# Patient Record
Sex: Female | Born: 1935 | Race: White | Hispanic: No | Smoking: Never smoker
Health system: Southern US, Community
[De-identification: ages and names within clinical notes are randomized; demographics above are authoritative.]

## PROBLEM LIST (undated history)

## (undated) DIAGNOSIS — J9611 Chronic respiratory failure with hypoxia: Secondary | ICD-10-CM

## (undated) DIAGNOSIS — I743 Embolism and thrombosis of arteries of the lower extremities: Secondary | ICD-10-CM

## (undated) DIAGNOSIS — Z87891 Personal history of nicotine dependence: Secondary | ICD-10-CM

## (undated) DIAGNOSIS — I82409 Acute embolism and thrombosis of unspecified deep veins of unspecified lower extremity: Secondary | ICD-10-CM

## (undated) DIAGNOSIS — Z8719 Personal history of other diseases of the digestive system: Secondary | ICD-10-CM

## (undated) DIAGNOSIS — E46 Unspecified protein-calorie malnutrition: Secondary | ICD-10-CM

## (undated) DIAGNOSIS — Z8673 Personal history of transient ischemic attack (TIA), and cerebral infarction without residual deficits: Secondary | ICD-10-CM

## (undated) DIAGNOSIS — D509 Iron deficiency anemia, unspecified: Secondary | ICD-10-CM

## (undated) DIAGNOSIS — I4891 Unspecified atrial fibrillation: Secondary | ICD-10-CM

## (undated) DIAGNOSIS — E0590 Thyrotoxicosis, unspecified without thyrotoxic crisis or storm: Secondary | ICD-10-CM

## (undated) DIAGNOSIS — G4733 Obstructive sleep apnea (adult) (pediatric): Secondary | ICD-10-CM

## (undated) DIAGNOSIS — J449 Chronic obstructive pulmonary disease, unspecified: Secondary | ICD-10-CM

## (undated) DIAGNOSIS — R296 Repeated falls: Secondary | ICD-10-CM

## (undated) HISTORY — PX: HERNIA REPAIR: SHX51

## (undated) HISTORY — PX: PERIPHERAL VASCULAR THROMBECTOMY: CATH118306

## (undated) HISTORY — PX: BACK SURGERY: SHX140

---

## 2015-07-31 DIAGNOSIS — Z8673 Personal history of transient ischemic attack (TIA), and cerebral infarction without residual deficits: Secondary | ICD-10-CM

## 2015-07-31 HISTORY — DX: Personal history of transient ischemic attack (TIA), and cerebral infarction without residual deficits: Z86.73

## 2015-10-15 DIAGNOSIS — Z8719 Personal history of other diseases of the digestive system: Secondary | ICD-10-CM

## 2015-10-15 HISTORY — DX: Personal history of other diseases of the digestive system: Z87.19

## 2016-08-21 DIAGNOSIS — I743 Embolism and thrombosis of arteries of the lower extremities: Secondary | ICD-10-CM

## 2016-08-21 HISTORY — DX: Embolism and thrombosis of arteries of the lower extremities: I74.3

## 2017-01-25 ENCOUNTER — Emergency Department (INDEPENDENT_AMBULATORY_CARE_PROVIDER_SITE_OTHER): Payer: Medicare Other

## 2017-01-25 ENCOUNTER — Other Ambulatory Visit: Payer: Self-pay

## 2017-01-25 ENCOUNTER — Emergency Department (INDEPENDENT_AMBULATORY_CARE_PROVIDER_SITE_OTHER)
Admission: EM | Admit: 2017-01-25 | Discharge: 2017-01-25 | Disposition: A | Discharge status: Other Acute Inpatient Hospital | Payer: Medicare Other | Source: Home / Self Care | Attending: Family Medicine | Admitting: Family Medicine

## 2017-01-25 DIAGNOSIS — I480 Paroxysmal atrial fibrillation: Secondary | ICD-10-CM

## 2017-01-25 DIAGNOSIS — R0602 Shortness of breath: Secondary | ICD-10-CM

## 2017-01-25 DIAGNOSIS — R0902 Hypoxemia: Secondary | ICD-10-CM

## 2017-01-25 HISTORY — DX: Acute embolism and thrombosis of unspecified deep veins of unspecified lower extremity: I82.409

## 2017-01-25 HISTORY — DX: Unspecified atrial fibrillation: I48.91

## 2017-01-25 LAB — POCT URINALYSIS DIP (MANUAL ENTRY)
Bilirubin, UA: NEGATIVE
Glucose, UA: NEGATIVE mg/dL
Ketones, POC UA: NEGATIVE mg/dL
Leukocytes, UA: NEGATIVE
Nitrite, UA: NEGATIVE
Protein Ur, POC: NEGATIVE mg/dL
Spec Grav, UA: 1.015 (ref 1.010–1.025)
Urobilinogen, UA: 0.2 E.U./dL
pH, UA: 6.5 (ref 5.0–8.0)

## 2017-01-25 LAB — POCT CBC W AUTO DIFF (K'VILLE URGENT CARE)

## 2017-01-25 NOTE — ED Notes (Signed)
Attempted x2 in left and right antecubital unsuccessfully.  Pt tolerated well.  No bruising noted.

## 2017-01-25 NOTE — ED Triage Notes (Signed)
Pt moved in to [facility] about 1 week ago.  Has not been not feeling for several days.  Daughter thinks she may have a UTI.  Fell off the shower stool this morning while taking a shower, states that she felt shaky inside.

## 2017-01-25 NOTE — ED Notes (Signed)
O2 sats L-94 Sit- 88 Stand - 91

## 2017-01-25 NOTE — ED Provider Notes (Signed)
CSN: [id number]     Arrival date & time 01/25/17  1538 History   First MD Initiated Contact with Patient 01/25/17 1526     Chief Complaint  Patient presents with  . Shortness of Breath  . Fatigue   (Consider location/radiation/quality/duration/timing/severity/associated sxs/prior Treatment) HPI Madison Roberson is a 83 y.o. female with hx of Afib and DVT on Xarelto presenting to UC with daughter c/o gradually worsening SOB over the last 1 week with associated generalized weakness. Pt moved into [place] about 1 month ago.  She notes she has had decreased appetite.  She did fall about 1 week ago and broke 3 ribs on Right side. She went to ED at that time and was prescribed hydrocodone for the past but was not admitted. That pain has improved significantly.  Daughter states pt did fall this morning while in the shower. A nurse was with pt. Pt and nurse stated she did not hit her head and pt does not believe she hit her ribs.  She was advised by PT a few days ago she needs to be on oxygen all the time.  She has been on about 2.5L but states she takes it off to go the the bathroom, which is when she started to feel bad.  Pt and daughter are also concerned about possible urinary infection.  Last time she had a UTI, a few years ago, she felt bad overall but did not have specific urinary symptoms.  Denies fever, chills, n/v/d.  She f/u with her cardiologist every 6 months.  She has been told before she has mild CHF. Last visit was about 4 months ago.  O2 Sat in triage prior to O2: 77% on RA O2 quickly improved to low 90s with 3L O2 via nasal canula.   Past Medical History:  Diagnosis Date  . Atrial fibrillation (HCC)   . DVT (deep venous thrombosis) (HCC)   . Hypoxia   . Stroke [facility])    Past Surgical History:  Procedure Laterality Date  . BACK SURGERY     2 Broken vertebrae  . HERNIA REPAIR    . PERIPHERAL VASCULAR THROMBECTOMY     2015 & 2018   History reviewed. No pertinent family  history. Social History  Substance Use Topics  . Smoking status: Not on file  . Smokeless tobacco: Not on file  . Alcohol use No   OB History    No data available     Review of Systems  Constitutional: Positive for fatigue. Negative for chills and fever.  HENT: Negative for congestion.   Respiratory: Positive for chest tightness and shortness of breath. Negative for wheezing.   Cardiovascular: Negative for chest pain, palpitations and leg swelling.  Gastrointestinal: Negative for diarrhea, nausea and vomiting.  Genitourinary: Negative for dysuria, frequency and hematuria.  Neurological: Positive for weakness (generalized). Negative for dizziness, syncope, light-headedness and headaches.    Allergies  Patient has no known allergies.  Home Medications   Prior to Admission medications   Medication Sig Start Date End Date Taking? Authorizing Provider  aspirin 81 MG chewable tablet Chew by mouth daily.   Yes [provider]  cetirizine (ZYRTEC) 5 MG tablet Take 5 mg by mouth daily.   Yes [provider]  digoxin (LANOXIN) 0.25 MG tablet Take 0.25 mg by mouth daily.    Yes [provider]  diltiazem (CARDIZEM) 120 MG tablet Take 120 mg by mouth 4 (four) times daily.   Yes [provider]  FLUoxetine (  PROZAC) 20 MG capsule Take 20 mg by mouth daily.   Yes [provider]  rivaroxaban (XARELTO) 10 MG TABS tablet Take 10 mg by mouth daily.   Yes [provider]   Meds Ordered and Administered this Visit  Medications - No data to display  BP (!) 145/85 (BP Location: Left Arm)   Pulse 85   Temp 97.7 F (36.5 C) (Oral)   SpO2 95%  Orthostatic VS for the past 24 hrs:  BP- Lying Pulse- Lying BP- Sitting Pulse- Sitting BP- Standing at 0 minutes Pulse- Standing at 0 minutes  01/25/17 1652 149/81 81 149/87 83 135/80 79    Physical Exam  Constitutional: She is oriented to person, place, and time. She appears well-developed and  well-nourished. No distress.  Elderly female sitting in wheelchair, NAD. Alert and cooperative during exam.  HENT:  Head: Normocephalic and atraumatic.  Mouth/Throat: Oropharynx is clear and moist.  Eyes: EOM are normal.  Neck: Normal range of motion. Neck supple.  Cardiovascular: Normal rate.  An irregularly irregular rhythm present.  Pulmonary/Chest: Effort normal and breath sounds normal. No respiratory distress. She has no wheezes. She has no rales. She exhibits no tenderness.  Abdominal: Soft. She exhibits no distension and no mass. There is no tenderness. There is no rebound and no guarding.  Musculoskeletal: Normal range of motion.  Neurological: She is alert and oriented to person, place, and time.  Skin: Skin is warm and dry. She is not diaphoretic.  Psychiatric: She has a normal mood and affect. Her behavior is normal.  Nursing note and vitals reviewed.   Urgent Care Course     Procedures (including critical care time)  Labs Review Labs Reviewed  POCT URINALYSIS DIP (MANUAL ENTRY) - Abnormal; Notable for the following:       Result Value   Blood, UA small (*)    All other components within normal limits  URINE CULTURE  POCT CBC W AUTO DIFF ([facility])    Imaging Review Dg Chest 2 View  Result Date: 01/25/2017 CLINICAL DATA:  83 year old female with history of weakness and shortness of breath. EXAM: CHEST  2 VIEW COMPARISON:  No priors. FINDINGS: Diffuse interstitial prominence and peribronchial cuffing. Small bilateral pleural effusions lying dependently. Dependent opacities bilaterally, presumably areas of passive subsegmental atelectasis in the lung bases. No definite acute consolidative airspace disease. No pneumothorax. No definite suspicious appearing pulmonary nodules or masses. Cephalization of the pulmonary vasculature. Mild cardiomegaly. Upper mediastinal contours are within normal limits. Aortic atherosclerosis. Lateral view demonstrates multiple old  vertebral body compression fractures with multiple vertebroplasty changes, most severe in the lower thoracic spine where there is an acute kyphotic deformity. IMPRESSION: 1. The appearance the chest suggests mild congestive heart failure, as above. 2. Aortic atherosclerosis. Electronically Signed   By: Trudie Reedaniel  Entrikin M.D.   On: 01/25/2017 16:22   Date/Time: 01/25/2017   15:51:53 Ventricular Rate: 70 PR Interval: * QRS Duration: 96 QT Interval: 450 QTC Calculation: 486 P-R-T axes: *   72   -87 Text Interpretation: Atrial fibrillation, septal infarct, age undetermined. ST & T wave abnormality, consider inferolateral ischemia. Abnormal EKG    MDM   1. Shortness of breath   2. Paroxysmal atrial fibrillation (HCC)     Pt c/o generalized weakness and fatigue with SOB over the last few days. Moved to [facility] 1 week ago.  Daughter accompanying pt very thorough with pt's PMH.  EKG: atrial fibrillation with Septal infarct, age undetermined. Discussed  with pt and daughter. Pt was believed to have had an MI in the past, had a heart cath that was normal. Hx of abnormal EKGs. F/u with cardiologist every 6 months.   CBC: slight elevated WBC, Platelets elevated   Discussed going to ED for further evaluation  Pt and daughter agreeable, would prevent to stay with [facility]. Due to cardiac hx, recommend [facility].  Pt agreeable with EMS transport due to need for continuous O2 via nasal canula.   5:53 PM EMS took over pt care. Due to high volume with EMS, EMS cannot leave [facility].  Pt will be taken to [facility] for further evaluation and treatment. Pt and daughter understanding and agreeable.      Lurene Shadow, [place] 01/25/17 1753

## 2017-01-26 ENCOUNTER — Telehealth: Payer: Self-pay | Admitting: *Deleted

## 2017-01-26 LAB — URINE CULTURE: Organism ID, Bacteria: NO GROWTH

## 2017-01-26 NOTE — Telephone Encounter (Signed)
[facility]. Her family member did not know any more information.

## 2017-07-25 HISTORY — PX: IM NAILING FEMORAL SHAFT FRACTURE: SUR731

## 2017-10-02 ENCOUNTER — Inpatient Hospital Stay (HOSPITAL_COMMUNITY)
Admission: EM | Admit: 2017-10-02 | Discharge: 2017-10-05 | DRG: 064 | Disposition: A | Discharge status: Skilled Nursing Facility | Payer: Medicare Other | Attending: Family Medicine | Admitting: Family Medicine

## 2017-10-02 ENCOUNTER — Other Ambulatory Visit: Payer: Self-pay

## 2017-10-02 ENCOUNTER — Emergency Department (HOSPITAL_COMMUNITY): Payer: Medicare Other

## 2017-10-02 ENCOUNTER — Encounter (HOSPITAL_COMMUNITY): Payer: Self-pay

## 2017-10-02 DIAGNOSIS — Z7902 Long term (current) use of antithrombotics/antiplatelets: Secondary | ICD-10-CM

## 2017-10-02 DIAGNOSIS — R296 Repeated falls: Secondary | ICD-10-CM

## 2017-10-02 DIAGNOSIS — G8194 Hemiplegia, unspecified affecting left nondominant side: Secondary | ICD-10-CM | POA: Diagnosis present

## 2017-10-02 DIAGNOSIS — E872 Acidosis: Secondary | ICD-10-CM | POA: Diagnosis present

## 2017-10-02 DIAGNOSIS — Z681 Body mass index (BMI) 19 or less, adult: Secondary | ICD-10-CM | POA: Diagnosis not present

## 2017-10-02 DIAGNOSIS — R29810 Facial weakness: Secondary | ICD-10-CM | POA: Diagnosis present

## 2017-10-02 DIAGNOSIS — I482 Chronic atrial fibrillation: Secondary | ICD-10-CM | POA: Diagnosis present

## 2017-10-02 DIAGNOSIS — I110 Hypertensive heart disease with heart failure: Secondary | ICD-10-CM | POA: Diagnosis present

## 2017-10-02 DIAGNOSIS — G894 Chronic pain syndrome: Secondary | ICD-10-CM | POA: Diagnosis present

## 2017-10-02 DIAGNOSIS — I4891 Unspecified atrial fibrillation: Secondary | ICD-10-CM | POA: Diagnosis present

## 2017-10-02 DIAGNOSIS — E860 Dehydration: Secondary | ICD-10-CM | POA: Diagnosis present

## 2017-10-02 DIAGNOSIS — J449 Chronic obstructive pulmonary disease, unspecified: Secondary | ICD-10-CM | POA: Diagnosis present

## 2017-10-02 DIAGNOSIS — D509 Iron deficiency anemia, unspecified: Secondary | ICD-10-CM | POA: Diagnosis present

## 2017-10-02 DIAGNOSIS — Z79899 Other long term (current) drug therapy: Secondary | ICD-10-CM

## 2017-10-02 DIAGNOSIS — I5042 Chronic combined systolic (congestive) and diastolic (congestive) heart failure: Secondary | ICD-10-CM | POA: Diagnosis present

## 2017-10-02 DIAGNOSIS — G4733 Obstructive sleep apnea (adult) (pediatric): Secondary | ICD-10-CM | POA: Diagnosis present

## 2017-10-02 DIAGNOSIS — R414 Neurologic neglect syndrome: Secondary | ICD-10-CM | POA: Diagnosis present

## 2017-10-02 DIAGNOSIS — Z87891 Personal history of nicotine dependence: Secondary | ICD-10-CM | POA: Diagnosis not present

## 2017-10-02 DIAGNOSIS — Z8673 Personal history of transient ischemic attack (TIA), and cerebral infarction without residual deficits: Secondary | ICD-10-CM

## 2017-10-02 DIAGNOSIS — I481 Persistent atrial fibrillation: Secondary | ICD-10-CM

## 2017-10-02 DIAGNOSIS — G9341 Metabolic encephalopathy: Secondary | ICD-10-CM | POA: Diagnosis not present

## 2017-10-02 DIAGNOSIS — J9611 Chronic respiratory failure with hypoxia: Secondary | ICD-10-CM | POA: Diagnosis present

## 2017-10-02 DIAGNOSIS — E43 Unspecified severe protein-calorie malnutrition: Secondary | ICD-10-CM | POA: Diagnosis not present

## 2017-10-02 DIAGNOSIS — I639 Cerebral infarction, unspecified: Secondary | ICD-10-CM | POA: Diagnosis not present

## 2017-10-02 DIAGNOSIS — Z9981 Dependence on supplemental oxygen: Secondary | ICD-10-CM | POA: Diagnosis not present

## 2017-10-02 DIAGNOSIS — D500 Iron deficiency anemia secondary to blood loss (chronic): Secondary | ICD-10-CM | POA: Diagnosis present

## 2017-10-02 DIAGNOSIS — Z7901 Long term (current) use of anticoagulants: Secondary | ICD-10-CM

## 2017-10-02 DIAGNOSIS — I351 Nonrheumatic aortic (valve) insufficiency: Secondary | ICD-10-CM | POA: Diagnosis not present

## 2017-10-02 DIAGNOSIS — Z5181 Encounter for therapeutic drug level monitoring: Secondary | ICD-10-CM | POA: Diagnosis not present

## 2017-10-02 DIAGNOSIS — Z86718 Personal history of other venous thrombosis and embolism: Secondary | ICD-10-CM

## 2017-10-02 DIAGNOSIS — E0500 Thyrotoxicosis with diffuse goiter without thyrotoxic crisis or storm: Secondary | ICD-10-CM | POA: Diagnosis present

## 2017-10-02 DIAGNOSIS — R5383 Other fatigue: Secondary | ICD-10-CM | POA: Diagnosis not present

## 2017-10-02 DIAGNOSIS — E46 Unspecified protein-calorie malnutrition: Secondary | ICD-10-CM | POA: Diagnosis present

## 2017-10-02 DIAGNOSIS — E039 Hypothyroidism, unspecified: Secondary | ICD-10-CM | POA: Diagnosis present

## 2017-10-02 DIAGNOSIS — Z9181 History of falling: Secondary | ICD-10-CM

## 2017-10-02 DIAGNOSIS — Z882 Allergy status to sulfonamides status: Secondary | ICD-10-CM

## 2017-10-02 DIAGNOSIS — R112 Nausea with vomiting, unspecified: Secondary | ICD-10-CM | POA: Diagnosis present

## 2017-10-02 DIAGNOSIS — E876 Hypokalemia: Secondary | ICD-10-CM | POA: Diagnosis present

## 2017-10-02 DIAGNOSIS — I63411 Cerebral infarction due to embolism of right middle cerebral artery: Principal | ICD-10-CM | POA: Diagnosis present

## 2017-10-02 DIAGNOSIS — R29711 NIHSS score 11: Secondary | ICD-10-CM | POA: Diagnosis present

## 2017-10-02 DIAGNOSIS — Z888 Allergy status to other drugs, medicaments and biological substances status: Secondary | ICD-10-CM

## 2017-10-02 DIAGNOSIS — E0590 Thyrotoxicosis, unspecified without thyrotoxic crisis or storm: Secondary | ICD-10-CM | POA: Diagnosis present

## 2017-10-02 DIAGNOSIS — I712 Thoracic aortic aneurysm, without rupture: Secondary | ICD-10-CM | POA: Diagnosis present

## 2017-10-02 DIAGNOSIS — Z7982 Long term (current) use of aspirin: Secondary | ICD-10-CM

## 2017-10-02 HISTORY — DX: Obstructive sleep apnea (adult) (pediatric): G47.33

## 2017-10-02 HISTORY — DX: Personal history of other diseases of the digestive system: Z87.19

## 2017-10-02 HISTORY — DX: Personal history of transient ischemic attack (TIA), and cerebral infarction without residual deficits: Z86.73

## 2017-10-02 HISTORY — DX: Unspecified protein-calorie malnutrition: E46

## 2017-10-02 HISTORY — DX: Chronic obstructive pulmonary disease, unspecified: J44.9

## 2017-10-02 HISTORY — DX: Chronic respiratory failure with hypoxia: J96.11

## 2017-10-02 HISTORY — DX: Embolism and thrombosis of arteries of the lower extremities: I74.3

## 2017-10-02 HISTORY — DX: Repeated falls: R29.6

## 2017-10-02 HISTORY — DX: Thyrotoxicosis, unspecified without thyrotoxic crisis or storm: E05.90

## 2017-10-02 HISTORY — DX: Iron deficiency anemia, unspecified: D50.9

## 2017-10-02 HISTORY — DX: Personal history of nicotine dependence: Z87.891

## 2017-10-02 LAB — COMPREHENSIVE METABOLIC PANEL
ALBUMIN: 3.7 g/dL (ref 3.5–5.0)
ALK PHOS: 163 U/L — AB (ref 38–126)
ALT: 9 U/L — ABNORMAL LOW (ref 14–54)
AST: 22 U/L (ref 15–41)
Anion gap: 16 — ABNORMAL HIGH (ref 5–15)
BILIRUBIN TOTAL: 0.7 mg/dL (ref 0.3–1.2)
BUN: 9 mg/dL (ref 6–20)
CALCIUM: 8.9 mg/dL (ref 8.9–10.3)
CO2: 18 mmol/L — ABNORMAL LOW (ref 22–32)
Chloride: 105 mmol/L (ref 101–111)
Creatinine, Ser: 0.82 mg/dL (ref 0.44–1.00)
GFR calc Af Amer: >60 mL/min (ref >60)
GLUCOSE: 148 mg/dL — AB (ref 65–99)
Potassium: 3.9 mmol/L (ref 3.5–5.1)
Sodium: 139 mmol/L (ref 135–145)
TOTAL PROTEIN: 7.1 g/dL (ref 6.5–8.1)

## 2017-10-02 LAB — URINALYSIS, ROUTINE W REFLEX MICROSCOPIC
Bilirubin Urine: NEGATIVE
GLUCOSE, UA: NEGATIVE mg/dL
Hgb urine dipstick: NEGATIVE
Ketones, ur: NEGATIVE mg/dL
LEUKOCYTES UA: NEGATIVE
Nitrite: NEGATIVE
PH: 7 (ref 5.0–8.0)
PROTEIN: NEGATIVE mg/dL
Specific Gravity, Urine: 1.039 — ABNORMAL HIGH (ref 1.005–1.030)

## 2017-10-02 LAB — DIFFERENTIAL
Basophils Absolute: 0.1 10*3/uL (ref 0.0–0.1)
Basophils Relative: 1 %
EOS PCT: 1 %
Eosinophils Absolute: 0.1 10*3/uL (ref 0.0–0.7)
LYMPHS ABS: 2.2 10*3/uL (ref 0.7–4.0)
LYMPHS PCT: 20 %
MONOS PCT: 4 %
Monocytes Absolute: 0.5 10*3/uL (ref 0.1–1.0)
NEUTROS PCT: 74 %
Neutro Abs: 8 10*3/uL — ABNORMAL HIGH (ref 1.7–7.7)

## 2017-10-02 LAB — I-STAT TROPONIN, ED: Troponin i, poc: 0.06 ng/mL (ref 0.00–0.08)

## 2017-10-02 LAB — PROTIME-INR
INR: 1
Prothrombin Time: 13.1 seconds (ref 11.4–15.2)

## 2017-10-02 LAB — I-STAT CHEM 8, ED
BUN: 10 mg/dL (ref 6–20)
CREATININE: 0.7 mg/dL (ref 0.44–1.00)
Calcium, Ion: 1.05 mmol/L — ABNORMAL LOW (ref 1.15–1.40)
Chloride: 106 mmol/L (ref 101–111)
Glucose, Bld: 148 mg/dL — ABNORMAL HIGH (ref 65–99)
HCT: 50 % — ABNORMAL HIGH (ref 36.0–46.0)
Hemoglobin: 17 g/dL — ABNORMAL HIGH (ref 12.0–15.0)
POTASSIUM: 4 mmol/L (ref 3.5–5.1)
Sodium: 141 mmol/L (ref 135–145)
TCO2: 22 mmol/L (ref 22–32)

## 2017-10-02 LAB — APTT: aPTT: 29 seconds (ref 24–36)

## 2017-10-02 LAB — CBG MONITORING, ED: Glucose-Capillary: 130 mg/dL — ABNORMAL HIGH (ref 65–99)

## 2017-10-02 LAB — CBC
HEMATOCRIT: 48.8 % — AB (ref 36.0–46.0)
HEMOGLOBIN: 15.2 g/dL — AB (ref 12.0–15.0)
MCH: 27.9 pg (ref 26.0–34.0)
MCHC: 31.1 g/dL (ref 30.0–36.0)
MCV: 89.5 fL (ref 78.0–100.0)
Platelets: 299 10*3/uL (ref 150–400)
RBC: 5.45 MIL/uL — ABNORMAL HIGH (ref 3.87–5.11)
RDW: 21.7 % — ABNORMAL HIGH (ref 11.5–15.5)
WBC: 10.8 10*3/uL — ABNORMAL HIGH (ref 4.0–10.5)

## 2017-10-02 MED ORDER — HYDRALAZINE HCL 20 MG/ML IJ SOLN
10.0000 mg | Freq: Four times a day (QID) | INTRAMUSCULAR | Status: DC | PRN
Start: 2017-10-02 — End: 2017-10-05

## 2017-10-02 MED ORDER — FLUOXETINE HCL 20 MG PO CAPS
20.0000 mg | ORAL_CAPSULE | Freq: Every day | ORAL | Status: DC
  Administered 2017-10-02 – 2017-10-05 (×4): 20 mg via ORAL
  Filled 2017-10-02 (×4): qty 1

## 2017-10-02 MED ORDER — SODIUM CHLORIDE 0.9 % IV SOLN
INTRAVENOUS | Status: DC
  Administered 2017-10-02 – 2017-10-03 (×2): via INTRAVENOUS

## 2017-10-02 MED ORDER — DIGOXIN 125 MCG PO TABS
0.1250 mg | ORAL_TABLET | Freq: Every day | ORAL | Status: DC
  Administered 2017-10-02 – 2017-10-05 (×4): 0.125 mg via ORAL
  Filled 2017-10-02 (×4): qty 1

## 2017-10-02 MED ORDER — STROKE: EARLY STAGES OF RECOVERY BOOK
Freq: Once | Status: DC
  Filled 2017-10-02: qty 1

## 2017-10-02 MED ORDER — PANTOPRAZOLE SODIUM 40 MG PO TBEC
40.0000 mg | DELAYED_RELEASE_TABLET | Freq: Every day | ORAL | Status: DC
  Administered 2017-10-02 – 2017-10-05 (×4): 40 mg via ORAL
  Filled 2017-10-02 (×4): qty 1

## 2017-10-02 MED ORDER — IOPAMIDOL (ISOVUE-370) INJECTION 76%
INTRAVENOUS | Status: AC
  Filled 2017-10-02: qty 50

## 2017-10-02 MED ORDER — CLOPIDOGREL BISULFATE 75 MG PO TABS
75.0000 mg | ORAL_TABLET | Freq: Every day | ORAL | Status: DC
  Administered 2017-10-02 – 2017-10-05 (×4): 75 mg via ORAL
  Filled 2017-10-02 (×4): qty 1

## 2017-10-02 MED ORDER — METHIMAZOLE 5 MG PO TABS
5.0000 mg | ORAL_TABLET | Freq: Every day | ORAL | Status: DC
  Administered 2017-10-02 – 2017-10-05 (×4): 5 mg via ORAL
  Filled 2017-10-02 (×4): qty 1

## 2017-10-02 MED ORDER — IOPAMIDOL (ISOVUE-370) INJECTION 76%: 100.0000 mL | Freq: Once | INTRAVENOUS | Status: DC | PRN

## 2017-10-02 MED ORDER — MIRTAZAPINE 15 MG PO TABS
15.0000 mg | ORAL_TABLET | Freq: Every day | ORAL | Status: DC
  Administered 2017-10-02 – 2017-10-04 (×3): 15 mg via ORAL
  Filled 2017-10-02 (×4): qty 1

## 2017-10-02 MED ORDER — IOPAMIDOL (ISOVUE-370) INJECTION 76%
100.0000 mL | Freq: Once | INTRAVENOUS | Status: AC | PRN
  Administered 2017-10-02: 100 mL via INTRAVENOUS

## 2017-10-02 MED ORDER — LORATADINE 10 MG PO TABS
10.0000 mg | ORAL_TABLET | Freq: Every day | ORAL | Status: DC
  Administered 2017-10-02 – 2017-10-05 (×4): 10 mg via ORAL
  Filled 2017-10-02 (×4): qty 1

## 2017-10-02 MED ORDER — FERROUS GLUCONATE 324 (38 FE) MG PO TABS
324.0000 mg | ORAL_TABLET | Freq: Three times a day (TID) | ORAL | Status: DC
  Administered 2017-10-03 – 2017-10-05 (×8): 324 mg via ORAL
  Filled 2017-10-02 (×10): qty 1

## 2017-10-02 MED ORDER — ACETAMINOPHEN 325 MG PO TABS
650.0000 mg | ORAL_TABLET | ORAL | Status: DC | PRN
  Administered 2017-10-02: 650 mg via ORAL
  Filled 2017-10-02: qty 2

## 2017-10-02 MED ORDER — OXYCODONE HCL 5 MG PO TABS
10.0000 mg | ORAL_TABLET | Freq: Three times a day (TID) | ORAL | Status: DC
  Administered 2017-10-02 – 2017-10-03 (×5): 10 mg via ORAL
  Filled 2017-10-02 (×6): qty 2

## 2017-10-02 MED ORDER — ENOXAPARIN SODIUM 40 MG/0.4ML ~~LOC~~ SOLN
40.0000 mg | SUBCUTANEOUS | Status: DC
  Administered 2017-10-02 – 2017-10-05 (×4): 40 mg via SUBCUTANEOUS
  Filled 2017-10-02 (×5): qty 0.4

## 2017-10-02 MED ORDER — ACETAMINOPHEN 160 MG/5ML PO SOLN: 650.0000 mg | ORAL | Status: DC | PRN

## 2017-10-02 MED ORDER — ACETAMINOPHEN 650 MG RE SUPP: 650.0000 mg | RECTAL | Status: DC | PRN

## 2017-10-02 MED ORDER — ATORVASTATIN CALCIUM 40 MG PO TABS
40.0000 mg | ORAL_TABLET | Freq: Every day | ORAL | Status: DC
  Administered 2017-10-02 – 2017-10-04 (×3): 40 mg via ORAL
  Filled 2017-10-02 (×4): qty 1

## 2017-10-02 MED ORDER — ASPIRIN EC 325 MG PO TBEC
325.0000 mg | DELAYED_RELEASE_TABLET | Freq: Every day | ORAL | Status: DC
  Administered 2017-10-02: 325 mg via ORAL
  Filled 2017-10-02: qty 1

## 2017-10-02 MED ORDER — POTASSIUM CHLORIDE CRYS ER 20 MEQ PO TBCR
20.0000 meq | EXTENDED_RELEASE_TABLET | Freq: Every day | ORAL | Status: DC
  Administered 2017-10-02 – 2017-10-05 (×4): 20 meq via ORAL
  Filled 2017-10-02 (×4): qty 1

## 2017-10-02 NOTE — Consult Note (Signed)
Referring Physician: Dr. Jeraldine Loots    Chief Complaint: Left hemiplegia and left hemineglect  HPI: Madison Roberson is an 84 y.o. female presenting from her SNF after staff found her to be hemiplegic on the left and with slurred speech this morning. LKN was 2100. On EMS arrival her BP was 138/91. Per EMS, she was drooling on the left, had left facial droop, not moving or paying attention to her left side. She was in a-fib at the time of EMS assessment with HR as high as 170, which later decreased to 100. CBG was 174. She endorsed no symptoms except for SOB and cough. No headache.   LSN: [id number] tPA Given: No: Out of time window  Past Medical History:  Diagnosis Date  . Atrial fibrillation (HCC)   . DVT (deep venous thrombosis) (HCC)   . Hypoxia   . Stroke [facility])     Past Surgical History:  Procedure Laterality Date  . BACK SURGERY     2 Broken vertebrae  . HERNIA REPAIR    . PERIPHERAL VASCULAR THROMBECTOMY     2015 & 2018    No family history on file. Social History:  reports that she does not drink alcohol or use drugs. Her tobacco history is not on file.  Allergies: No Known Allergies  Home Medications: ASA Diltiazem Ferrous gluconate Klor-Con Lanoxin Melatonin Methimazole Oxycodone Prozac Remeron Tylenol Zyrtec  ROS: As per HPI.   Physical Examination: There were no vitals taken for this visit.  General: Frail appearing, NAD HEENT: [place]/AT Lungs: Respirations unlabored Ext: Thin skin, no edema  Neurologic Examination: Mental Status: Awake and alert. Fully oriented. Able to follow all right sided commands. Anosognosia with left sided neglect.  Cranial Nerves: II:  PERRL. Left homonymous hemianopsia III,IV, VI: No ptosis.  Right gaze preference; able to cross midline and gaze to the left but with some hesitancy V,VII: Mild left facial droop. Insensate to FT and temp on the left.   VIII: hearing intact to voice IX,X: No hypophonia XI: Head preferentially rotated  to right.  Motor: LUE: 0/5, no movement spontaneously or to any stimulus. Flaccid tone. LLE: Toes moving spontaneously, elevates 3/5 RUE: 5/5 for age RLE: 5/5 for age Sensory: Left hemineglect with absent FT sensation. Winces to noxious stimulation of LUE but does not localize.  Deep Tendon Reflexes:  2+ bilateral brachioradialis and biceps 2+ patellae bilaterally Right toe upgoing, left toe downgoing Cerebellar: No ataxia with FNF on the right. Unable to perform on the left.  Gait: Deferred due to acuity of presentation  Results for orders placed or performed during the hospital encounter of 10/02/17 (from the past 48 hour(s))  CBG monitoring, ED     Status: Abnormal   Collection Time: 10/02/17  9:46 AM  Result Value Ref Range   Glucose-Capillary 130 (H) 65 - 99 mg/dL   CT head: 1. No acute cortically based infarct or acute intracranial hemorrhage identified. ASPECTS 10. 2. Chronic small vessel disease with age indeterminate involvement of the right pons. 3. Left greater than right cerebellar infarcts, probably chronic.  CTA head/neck, CTP: 1. Constellation of findings suspicious for a right MCA M2 occlusion at the trifurcation, although no discrete occluded vessel is identified on the CTA. Some artifactual findings on CT perfusion are suspected, but CTP suggests a posterior right MCA territory penumbra with no core infarct detected. 2. The above was discussed by telephone with Dr. Caryl Pina on 10/02/2017 at 1024 hours. 3. Underlying generalized Arterial Ectasia, including ectatic aortic  arch (see #5), bilateral carotid arteries, and aberrant right subclavian artery. Atherosclerosis, but no hemodynamically significant stenosis in the neck or at the skull base. 4. Aortic Atherosclerosis (ICD10-I70.0) and Emphysema (ICD10-J43.9). 5. 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 female presenting with acute ischemic stroke involving the  left MCA territory.  1. No hemorrhage or acute hypodensity on CT head. Chronic cerebellar infarcts are noted.  2. CTA: Constellation of findings suspicious for a right MCA M2 occlusion at the trifurcation, although no discrete occluded vessel is identified on the CTA. 3. CTP: Some artifactual findings on CT perfusion are suspected, but CTP suggests a posterior right MCA territory penumbra with no core infarct detected. 4. Stroke Risk Factors - Atrial fiibrillation, DVT, prior stroke.  5. History of frequent falls, which may be the reason why she is not on anticoagulation  Plan: 1. HgbA1c, fasting lipid panel 2. MRI, of the brain without contrast 3. PT consult, OT consult, Speech consult 4. Echocardiogram 5. Telemetry monitoring 6. Prophylactic therapy- Switch ASA to Plavix 7. Risk factor modification 8. Frequent neuro checks 9. Modified permissive HTN protocol for 24 hours: Treat BP if systolic is > 160 10. Gentle IV hydration  50 minutes of time spent in the emergent neurological evaluation and management of this critically ill acute stroke patient  @Electronically  signed: Dr. Caryl PinaEric Lynell Kussman@ 10/02/2017, 9:52 AM

## 2017-10-02 NOTE — ED Provider Notes (Signed)
[facility] EMERGENCY DEPARTMENT Provider Note   CSN: [id number] Arrival date & time: 10/02/17  0940     History   Chief Complaint No chief complaint on file.   HPI Madison Roberson is a 84 y.o. female.  HPI  Presents after last being seen normal 10 hours ago, before going to sleep in a nursing facility.  Like this morning she was found on the floor by nursing staff. Report of the patient is capable of ambulating, though she does use a wheelchair for assistance. Patient is also typically verbal, interactive, conversational. However, the staff found her nonverbal, with left-sided facial droop, left-sided weakness this morning. She was admitted a code stroke, brought here for evaluation. Level 5 caveat secondary to the patient's acute clinical condition, essentially nonverbal status.  Past Medical History:  Diagnosis Date  . Atrial fibrillation (HCC)   . DVT (deep venous thrombosis) (HCC)   . Hypoxia   . Stroke [facility])     There are no active problems to display for this patient.   Past Surgical History:  Procedure Laterality Date  . BACK SURGERY     2 Broken vertebrae  . HERNIA REPAIR    . PERIPHERAL VASCULAR THROMBECTOMY     2015 & 2018     OB History   None      Home Medications    Prior to Admission medications   Medication Sig Start Date End Date Taking? Authorizing Provider  aspirin 81 MG chewable tablet Chew by mouth daily.    [provider]  cetirizine (ZYRTEC) 5 MG tablet Take 5 mg by mouth daily.    [provider]  digoxin (LANOXIN) 0.25 MG tablet Take 0.25 mg by mouth daily.     [provider]  diltiazem (CARDIZEM) 120 MG tablet Take 120 mg by mouth 4 (four) times daily.    [provider]  FLUoxetine (PROZAC) 20 MG capsule Take 20 mg by mouth daily.    [provider]  rivaroxaban (XARELTO) 10 MG TABS tablet Take 10 mg by mouth daily.    [provider]    Family History No  family history on file.  Social History Social History   Tobacco Use  . Smoking status: Not on file  Substance Use Topics  . Alcohol use: No  . Drug use: No     Allergies   Patient has no known allergies.   Review of Systems Review of Systems  Unable to perform ROS: Mental status change     Physical Exam Updated Vital Signs There were no vitals taken for this visit.  Physical Exam  Constitutional: She has a sickly appearance. No distress.  Sickly elderly female in gurney, with left-sided hemineglect minimally verbal  HENT:  Head: Normocephalic and atraumatic.  Eyes: Conjunctivae and EOM are normal.  Cardiovascular: Normal rate and regular rhythm.  Murmur heard. Pulmonary/Chest: Effort normal and breath sounds normal. No stridor. No respiratory distress.  Abdominal: She exhibits no distension.  Musculoskeletal: She exhibits no edema.  Neurological: She is alert. A cranial nerve deficit is present. She exhibits abnormal muscle tone. She displays no seizure activity.  Patient has left-sided hemineglect, and agnosia, minimally verbal, brief, difficult to understand, left-sided facial droop, left-sided hemiplegia  Skin: Skin is warm and dry.  Psychiatric: Cognition and memory are impaired.  Nursing note and vitals reviewed.    ED Treatments / Results  Labs (all labs ordered are listed, but only abnormal results are displayed) Labs Reviewed  CBG MONITORING, ED - Abnormal; Notable for the following components:      Result Value   Glucose-Capillary 130 (*)    All other components within normal limits  PROTIME-INR  APTT  CBC  DIFFERENTIAL  COMPREHENSIVE METABOLIC PANEL  I-STAT TROPONIN, ED  I-STAT CHEM 8, ED    EKG EKG Interpretation  Date/Time:  Saturday October 02 2017 10:29:03 EDT Ventricular Rate:  94 PR Interval:    QRS Duration: 105 QT Interval:  362 QTC Calculation: 453 R Axis:   102 Text Interpretation:  Atrial fibrillation Right axis deviation  Probable anteroseptal infarct, old Repol abnrm suggests ischemia, diffuse leads Artifact ST-t wave abnormality Abnormal ekg Confirmed by Gerhard MunchLockwood, Croix Presley [phone]([id number]) on 10/02/2017 10:35:50 AM   Radiology Ct Cerebral Perfusion W Contrast  Result Date: 10/02/2017 CLINICAL DATA:  84 year old female code stroke clinically with symptoms of right MCA thrombosis. EXAM: CT ANGIOGRAPHY HEAD AND NECK CT PERFUSION BRAIN TECHNIQUE: Multidetector CT imaging of the head and neck was performed using the standard protocol during bolus administration of intravenous contrast. Multiplanar CT image reconstructions and MIPs were obtained to evaluate the vascular anatomy. Carotid stenosis measurements (when applicable) are obtained utilizing NASCET criteria, using the distal internal carotid diameter as the denominator. Multiphase CT imaging of the brain was performed following IV bolus contrast injection. Subsequent parametric perfusion maps were calculated using RAPID software. CONTRAST:  95 milliliters Isovue 370 COMPARISON:  Presentation noncontrast head CT 0957 hours today. FINDINGS: CT Brain Perfusion Findings: The study is somewhat limited by motion artifact occuring late in the data acquisition. And subsequently abnormal T-max in the posterior fossa is probably artifact. ASPECTS 10 at 0957 hours today. CBF (<30%) Volume: zero, no core infarct detected. Perfusion (Tmax>6.0s) volume: Evidence of penumbra in the posterior right MCA territory, volume less than 36 mL (considering the probably artifactual volume penumbra in the posterior fossa) Mismatch Volume: Not applicable Infarction Location:Not applicable CTA NECK Skeleton: Moderate and severe degenerative changes in the cervical spine including 3-4 millimeters of anterolisthesis of C3 on C4 and reversed cervical lordosis. Underlying thoracic scoliosis. Osteopenia. No acute osseous abnormality identified. Upper chest: Centrilobular emphysema with superimposed dependent pulmonary  opacity in the left upper lobe and superior segment right lower lobe. The visible Major airways are patent. No superior mediastinal lymphadenopathy. Other neck: Large left thyroid goiter with mild regional mass effect. Partially retropharyngeal course of both carotid arteries. Otherwise negative. No cervical lymphadenopathy. Aortic arch: Ectatic visible thoracic aorta with the aortic arch measuring about 36 millimeters diameter and the proximal descending thoracic aorta up to 40 millimeters diameter. Scattered calcified arch atherosclerosis. Four vessel arch configuration with an aberrancy origin of the right subclavian artery. Tortuous proximal great vessels. Right carotid system: Dedicated right CCA origin off of the arch with tortuosity through the superior mediastinum and thoracic inlet. No proximal CCA stenosis. Widely patent right carotid bifurcation with mild calcified plaque. Severely tortuous and ectatic but widely patent cervical right ICA to the skull base. Left carotid system: Calcified plaque at the left CCA origin not resulting in significant stenosis. Tortuous left CCA at the thoracic inlet and in the neck with a partially retropharyngeal course. Bulky calcified plaque at the left carotid bifurcation resulting in an estimated 50 % stenosis with respect to the distal vessel just proximal to the bifurcation. The left ICA origin and bulb then are widely patent. The cervical left ICA is severely tortuous and ectatic but patent to the skull base without additional stenosis. Vertebral arteries: Aberrancy origin of  the right subclavian artery with ectasia and plaque but no stenosis proximal to the right vertebral artery origin. The right vertebral artery origin is patent without stenosis. The right V1 segment is highly tortuous. The right V 2 segment is tortuous. The right vertebral is patent to the skull base without stenosis. No proximal left subclavian artery stenosis despite calcified plaque. Mild left  subclavian tortuosity. Normal left vertebral artery origin. Highly tortuous left V1 and V2 segments. No left vertebral artery plaque or stenosis identified to the skull base. CTA HEAD Posterior circulation: Patent distal vertebral arteries without stenosis, the left is dominant. Patent bilateral PICA origins. Patent vertebrobasilar junction. Patent basilar artery with mild irregularity and tortuosity. Patent SCA origins. Fetal type bilateral PCA origins, more so the right. The proximal PCAs are patent without significant stenosis. The bilateral PCAs are regular, especially both inferior P3 divisions, but demonstrate preserved distal enhancement. Anterior circulation: Both ICA siphons are patent. There is left siphon ectasia and calcified plaque with only mild cavernous segment stenosis. The left ophthalmic and posterior communicating artery origins are normal. The right siphon is ectatic with mild plaque and no significant stenosis. Normal right ophthalmic and posterior communicating artery origins. Patent carotid termini, MCA and ACA origins. Dominant left A1. Anterior communicating artery and bilateral ACA branches are within normal limits. Left MCA M1 segment and bifurcation are patent. The left MCA branches appear patent with only mild irregularity. The right MCA M1 segment is patent without stenosis. The right MCA bifurcation is patent, but there is asymmetric paucity of right MCA M2 and M3 branches suspicious for occlusion of a middle sylvian M2 branch, although no discretely occluded vessel is evident. The visible enhancing right MCA branches are irregular but patent without a proximal high-grade stenosis. Venous sinuses: Appear patent on these early arterial phase images. Anatomic variants: Aberrancy right subclavian artery origin. Dominant distal left vertebral artery. Dominant left ACA A1 segment. Review of the MIP images confirms the above findings IMPRESSION: 1. Constellation of findings suspicious for a  right MCA M2 occlusion at the trifurcation, although no discrete occluded vessel is identified on the CTA. Some artifactual findings on CT perfusion are suspected, but CTP suggests a posterior right MCA territory penumbra with no core infarct detected. 2. The above was discussed by telephone with Dr. Caryl Pina on 10/02/2017 at 1024 hours. 3. Underlying generalized Arterial Ectasia, including ectatic aortic arch (see #5), bilateral carotid arteries, and aberrant right subclavian artery. Atherosclerosis, but no hemodynamically significant stenosis in the neck or at the skull base. 4. Aortic Atherosclerosis (ICD10-I70.0) and Emphysema (ICD10-J43.9). 5. Aneurysmal descending thoracic aorta, 40 millimeters diameter. Recommend annual imaging followup by CTA or MRA. This recommendation follows 2010 ACCF/AHA/AATS/ACR/ASA/SCA/SCAI/SIR/STS/SVM Guidelines for the Diagnosis and Management of Patients with Thoracic Aortic Disease. Circulation. 2010; 121: Y782-N562. Electronically Signed   By: Odessa Fleming M.D.   On: 10/02/2017 10:46   Ct Head Code Stroke Wo Contrast  Result Date: 10/02/2017 CLINICAL DATA:  Code stroke. 84 year old female with left side weakness, facial droop, rightward gaze. Last seen normal 2100 hours yesterday. EXAM: CT HEAD WITHOUT CONTRAST TECHNIQUE: Contiguous axial images were obtained from the base of the skull through the vertex without intravenous contrast. COMPARISON:  None. FINDINGS: Brain: Cerebral volume is within normal limits for age. No ventriculomegaly. No midline shift, mass effect, or evidence of intracranial mass lesion. No acute intracranial hemorrhage identified. Widespread and confluent bilateral cerebral white matter hypodensity. Deep white matter capsule involvement. No superimposed acute cortically based infarct identified.  Heterogeneity in the caudal left thalamus and midbrain. Asymmetric heterogeneity in the right pons. There are multiple small bilateral cerebellar infarcts, more  numerous on the left. These are likely chronic. Vascular: Calcified atherosclerosis at the skull base. No suspicious intracranial vascular hyperdensity. Skull: No acute osseous abnormality identified. Sinuses/Orbits: Fluid level in the right maxillary sinus. Scattered ethmoid opacification. Other paranasal sinuses, the tympanic cavities, and mastoids are well pneumatized. Other: Only slight rightward gaze deviation on these images. No acute orbit or scalp soft tissue findings identified. ASPECTS [facility] Stroke Program Early CT Score) - Ganglionic level infarction (caudate, lentiform nuclei, internal capsule, insula, M1-M3 cortex): 7 - Supraganglionic infarction (M4-M6 cortex): 3 Total score (0-10 with 10 being normal): 10 IMPRESSION: 1. No acute cortically based infarct or acute intracranial hemorrhage identified. ASPECTS 10. 2. Chronic small vessel disease with age indeterminate involvement of the right pons. 3. Left greater than right cerebellar infarcts, probably chronic. 4. These results were communicated to Dr. Otelia Limes at 10:05 amon 4/13/2019by text page via the [facility] messaging system. Electronically Signed   By: Odessa Fleming M.D.   On: 10/02/2017 10:06    Procedures Procedures (including critical care time)  Medications Ordered in ED Medications  iopamidol (ISOVUE-370) 76 % injection (has no administration in time range)  iopamidol (ISOVUE-370) 76 % injection 100 mL (has no administration in time range)  iopamidol (ISOVUE-370) 76 % injection 100 mL (100 mLs Intravenous Contrast Given 10/02/17 1025)     Initial Impression / Assessment and Plan / ED Course  I have reviewed the triage vital signs and the nursing notes.  Pertinent labs & imaging results that were available during my care of the patient were reviewed by me and considered in my medical decision making (see chart for details).   Immediately after the patient's evaluation, on arrival to the emergency department she had emergent head CT  performed, and preliminary physical exam performed with our neurology colleagues.  10:36 AM Patient's CT consistent with acute stroke. Given her profound deficits, thrombectomy considered, with Dr. Otelia Limes. Patient's functional status is very low, and she is sickly, and not a good candidate for acute intervention. Patient is incapable of making her own decisions given her clinical condition, and no family members are available to provide consent.  This elderly female presents after being found with substantial neurologic changes from baseline. Patient has acute stroke, will past the window of TPA benefit, and given her substantial medical issues, she is a poor candidate for acute thrombectomy. Patient required admission for further evaluation and management.  Final Clinical Impressions(s) / ED Diagnoses  Acute stroke    Gerhard Munch, MD 10/02/17 1127

## 2017-10-02 NOTE — ED Notes (Addendum)
Attempted to call family.

## 2017-10-02 NOTE — ED Triage Notes (Signed)
Pt from [facility] with Last known well at 2100 pt this morning has left sided weakness and neglect

## 2017-10-02 NOTE — H&P (Addendum)
History and Physical    Madison Roberson UEA:[id number] DOB: Apr 11, 1930 DOA: 10/02/2017  **Will admit patient based on the expectation that the patient will need hospitalization/ hospital care that crosses at least 2 midnights  PCP: Ma Rings, MD   Attending physician: Jamse Arn  Patient coming from/Resides with: [facility]  Chief Complaint: Strokelike symptoms  HPI: Madison Roberson is a 84 y.o. 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  Patient was last hospitalized at [facility] in February 2019 after falling and sustaining a femur fracture that required a left intramedullary nailing procedure.  She was discharged to a rehab facility but eventually was placed in SNF due to inability to return safely to the home environment.  Patient LSN 2100 on 4/12.  This morning she was found down at nursing facility by nursing staff and was noted with left-sided weakness, left facial droop and neglect.  Initial CT of the head unremarkable but CT perfusion revealed evidence of likely embolic event involving the right MCA with an M2 occlusion.  She presented as a code stroke and has been evaluated by Dr. Cheral Marker.  CTA head and neck pending at time of my evaluation.  ED Course:  Vital Signs: BP (!) 144/89   Pulse 87   Resp 16   SpO2 99%  CT head: No acute stroke but noted with old pons and cerebellar infarcts CT perfusion brain: Likely area right MCA M2 occlusion consistent with embolic event CTA head and neck: Pending at time of admission Lab data: 139, potassium 3.9, chloride 105, CO2 18, glucose 148, BUN 9, creatinine 0.82, calcium  8.9, anion gap 16, alkaline phosphatase 163, LFTs not elevated, white count 10,800 with neutrophils 74% and absolute neutrophils 8%, hemoglobin 15.2, platelets 299,000, coags normal Medications and treatments: IV placed  Review of Systems:  In addition to the HPI above,  No Fever-chills, myalgias or other constitutional symptoms No Headache, changes with Vision or hearing, dizziness, dysarthria or word finding difficulty, tremors or seizure activity No problems swallowing food or Liquids, indigestion/reflux, choking or coughing while eating, abdominal pain with or after eating No Chest pain, Cough or Shortness of Breath, palpitations, orthopnea or DOE No Abdominal pain, N/V, melena,hematochezia, dark tarry stools, constipation No dysuria, malodorous urine, hematuria or flank pain No new skin rashes, lesions, masses or bruises, No new joint pains, aches, swelling or redness No recent unintentional weight gain  No polyuria, polydypsia or polyphagia   Past Medical History:  Diagnosis Date  . Arterial embolism of left leg ([place]) 08/21/2016  . Atrial fibrillation ([place])   . Chronic respiratory failure with hypoxia ([place])   . COPD (chronic obstructive pulmonary disease) ([place])   . DVT (deep venous thrombosis) ([place])   . Fe deficiency anemia   . Former tobacco use    quit 25 years ago  . Frequent falls   . History of cerebellar CVA (cerebrovascular accident) 07/31/2015  . History of GI bleed 10/15/2015  . Hyperthyroidism   . OSA (obstructive sleep apnea)   . Protein calorie malnutrition ([place])     Past Surgical History:  Procedure Laterality Date  . BACK SURGERY     2 Broken vertebrae  . HERNIA REPAIR    .  IM NAILING FEMORAL SHAFT FRACTURE Left 07/25/2017  . PERIPHERAL VASCULAR THROMBECTOMY     2015 & 2018    Social History   Socioeconomic History  . Marital status: Widowed    Spouse name: Not on file  . Number of children: Not on file  . Years of education: Not on file  .  Highest education level: Not on file  Occupational History  . Not on file  Social Needs  . Financial resource strain: Not on file  . Food insecurity:    Worry: Not on file    Inability: Not on file  . Transportation needs:    Medical: Not on file    Non-medical: Not on file  Tobacco Use  . Smoking status: Never Smoker  . Smokeless tobacco: Never Used  Substance and Sexual Activity  . Alcohol use: No  . Drug use: No  . Sexual activity: Not Currently  Lifestyle  . Physical activity:    Days per week: Not on file    Minutes per session: Not on file  . Stress: Not on file  Relationships  . Social connections:    Talks on phone: Not on file    Gets together: Not on file    Attends religious service: Not on file    Active member of club or organization: Not on file    Attends meetings of clubs or organizations: Not on file    Relationship status: Not on file  . Intimate partner violence:    Fear of current or ex partner: Not on file    Emotionally abused: Not on file    Physically abused: Not on file    Forced sexual activity: Not on file  Other Topics Concern  . Not on file  Social History Narrative   Resides at SNF post left IMN surgery and associated deconditioning    Mobility: Full assist with PT utilizing rolling walker Work history: Not obtained   Allergies  Allergen Reactions  . Lyrica [Pregabalin]     Unknown  . Metoclopromide [Metoclopramide] Other (See Comments)    Confusion  . Duloxetine Other (See Comments)    Dizziness  . Sulfa Antibiotics Nausea And Vomiting    Family history reviewed and not pertinent to current admission findings and diagnosis  Prior to Admission medications   Medication Sig Start Date End Date Taking? Authorizing Provider  acetaminophen (TYLENOL) 650 MG CR tablet Take 650 mg by mouth 3 (three) times daily as needed for pain.   Yes [provider]  aspirin 81 MG chewable tablet Chew by mouth daily.   Yes [provider]  cetirizine (ZYRTEC) 10 MG tablet Take 10 mg by mouth daily.    Yes [provider]  digoxin (LANOXIN) 0.125 MG tablet Take 0.125 mcg by mouth daily.    Yes [provider]  diltiazem (CARDIZEM CD) 180 MG 24 hr capsule Take 180 mg by mouth daily. 01/28/17  Yes [provider]  ferrous gluconate (FERGON) 225 (27 Fe) MG tablet Take 240 mg by mouth 3 (three) times daily with meals. 07/29/17  Yes [provider]  FLUoxetine (PROZAC) 20 MG capsule Take 20 mg by mouth daily.   Yes [provider]  methimazole (TAPAZOLE) 5 MG tablet Take 5 mg by mouth daily.   Yes [provider]  mirtazapine (REMERON) 15 MG tablet Take 15 mg by mouth at bedtime.   Yes [provider]  OXYCODONE HCL PO Take 10 mg by mouth 3 (  three) times daily. 08/17/17  Yes [provider]  OXYGEN Inhale 2.5 L into the lungs continuous as needed.   Yes [provider]  potassium chloride SA (K-DUR,KLOR-CON) 20 MEQ tablet Take 20 mEq by mouth daily. 01/28/17  Yes [provider]  rivaroxaban (XARELTO) 10 MG TABS tablet Take 10 mg by mouth daily.   Yes [provider]    Physical Exam: Vitals:   10/02/17 1115 10/02/17 1130 10/02/17 1145 10/02/17 1200  BP: 138/85 (!) 140/99 (!) 146/96 (!) 144/89  Pulse: 78 90 80 87  Resp: (!) 21 17 (!) 23 16  SpO2: 97% 99% 99% 99%      Constitutional: NAD, calm, comfortable Eyes: PERRL, lids and conjunctivae normal-right/downward gaze preference noted ENMT: Mucous membranes are dry. Posterior pharynx clear of any exudate or lesions.age-appropriate dentition.  Neck: normal, supple, no masses, no thyromegaly Respiratory: clear to auscultation bilaterally, no wheezing, no crackles. Normal respiratory effort. No accessory muscle use.  Cardiovascular: Regular rate and rhythm, no murmurs / rubs / gallops. No extremity edema. 2+ pedal pulses. No carotid bruits.  Abdomen: no tenderness, no masses  palpated. No hepatosplenomegaly. Bowel sounds positive.  Musculoskeletal: no clubbing / cyanosis. No joint deformity upper and lower extremities. Good ROM, no contractures. Normal muscle tone.  Skin: no rashes, lesions, ulcers. No induration-ecchymosis noted left anterior tibialis irregular shaped but essentially 4 x 4 cm in diameter Neurologic: CN 2-12 grossly intact-left facial droop-peripheral vision intact on right side only with noted preferential right gaze and right downward gaze. Sensation intact right side-LLEwith retained sensation, left upper extremity insensate, DTR normal. Strength 4/5 right side-patient noted having difficulty with utilizing right arm to maneuver cup with straw to mouth to drink; LUE gross motor strength 1-2/5 with strength predominantly in proximal aspects with distal aspects quite weak with no fine motor strength noted in hand-LLE strength 3/5.  Psychiatric: Alert and oriented x name and place. Normal mood.    Labs on Admission: I have personally reviewed following labs and imaging studies  CBC: Recent Labs  Lab 10/02/17 0951 10/02/17 0958  WBC  --  10.8*  NEUTROABS  --  8.0*  HGB 17.0* 15.2*  HCT 50.0* 48.8*  MCV  --  89.5  PLT  --  858   Basic Metabolic Panel: Recent Labs  Lab 10/02/17 0951 10/02/17 0958  NA 141 139  K 4.0 3.9  CL 106 105  CO2  --  18*  GLUCOSE 148* 148*  BUN 10 9  CREATININE 0.70 0.82  CALCIUM  --  8.9   GFR: CrCl cannot be calculated (Unknown ideal weight.). Liver Function Tests: Recent Labs  Lab 10/02/17 0958  AST 22  ALT 9*  ALKPHOS 163*  BILITOT 0.7  PROT 7.1  ALBUMIN 3.7   No results for input(s): LIPASE, AMYLASE in the last 168 hours. No results for input(s): AMMONIA in the last 168 hours. Coagulation Profile: Recent Labs  Lab 10/02/17 0958  INR 1.00   Cardiac Enzymes: No results for input(s): CKTOTAL, CKMB, CKMBINDEX, TROPONINI in the last 168 hours. BNP (last 3 results) No results for input(s):  PROBNP in the last 8760 hours. HbA1C: No results for input(s): HGBA1C in the last 72 hours. CBG: Recent Labs  Lab 10/02/17 0946  GLUCAP 130*   Lipid Profile: No results for input(s): CHOL, HDL, LDLCALC, TRIG, CHOLHDL, LDLDIRECT in the last 72 hours. Thyroid Function Tests: No results for input(s): TSH, T4TOTAL, FREET4, T3FREE, THYROIDAB in the last 72 hours. Anemia  Panel: No results for input(s): VITAMINB12, FOLATE, FERRITIN, TIBC, IRON, RETICCTPCT in the last 72 hours. Urine analysis:    Component Value Date/Time   COLORURINE STRAW (A) 10/02/2017 1225   APPEARANCEUR CLEAR 10/02/2017 1225   LABSPEC 1.039 (H) 10/02/2017 1225   PHURINE 7.0 10/02/2017 1225   GLUCOSEU NEGATIVE 10/02/2017 1225   HGBUR NEGATIVE 10/02/2017 1225   BILIRUBINUR NEGATIVE 10/02/2017 1225   BILIRUBINUR negative 01/25/2017 1620   KETONESUR NEGATIVE 10/02/2017 1225   PROTEINUR NEGATIVE 10/02/2017 1225   UROBILINOGEN 0.2 01/25/2017 1620   NITRITE NEGATIVE 10/02/2017 1225   LEUKOCYTESUR NEGATIVE 10/02/2017 1225   Sepsis Labs: '@LABRCNTIP'$ (procalcitonin:4,lacticidven:4) )No results found for this or any previous visit (from the past 240 hour(s)).   Radiological Exams on Admission: Ct Angio Head W Or Wo Contrast  Result Date: 10/02/2017 CLINICAL DATA:  84 year old female code stroke clinically with symptoms of right MCA thrombosis. EXAM: CT ANGIOGRAPHY HEAD AND NECK CT PERFUSION BRAIN TECHNIQUE: Multidetector CT imaging of the head and neck was performed using the standard protocol during bolus administration of intravenous contrast. Multiplanar CT image reconstructions and MIPs were obtained to evaluate the vascular anatomy. Carotid stenosis measurements (when applicable) are obtained utilizing NASCET criteria, using the distal internal carotid diameter as the denominator. Multiphase CT imaging of the brain was performed following IV bolus contrast injection. Subsequent parametric perfusion maps were calculated  using RAPID software. CONTRAST:  95 milliliters Isovue 370 COMPARISON:  Presentation noncontrast head CT 0957 hours today. FINDINGS: CT Brain Perfusion Findings: The study is somewhat limited by motion artifact occuring late in the data acquisition. And subsequently abnormal T-max in the posterior fossa is probably artifact. ASPECTS 10 at 0957 hours today. CBF (<30%) Volume: zero, no core infarct detected. Perfusion (Tmax>6.0s) volume: Evidence of penumbra in the posterior right MCA territory, volume less than 36 mL (considering the probably artifactual volume penumbra in the posterior fossa) Mismatch Volume: Not applicable Infarction Location:Not applicable CTA NECK Skeleton: Moderate and severe degenerative changes in the cervical spine including 3-4 millimeters of anterolisthesis of C3 on C4 and reversed cervical lordosis. Underlying thoracic scoliosis. Osteopenia. No acute osseous abnormality identified. Upper chest: Centrilobular emphysema with superimposed dependent pulmonary opacity in the left upper lobe and superior segment right lower lobe. The visible Major airways are patent. No superior mediastinal lymphadenopathy. Other neck: Large left thyroid goiter with mild regional mass effect. Partially retropharyngeal course of both carotid arteries. Otherwise negative. No cervical lymphadenopathy. Aortic arch: Ectatic visible thoracic aorta with the aortic arch measuring about 36 millimeters diameter and the proximal descending thoracic aorta up to 40 millimeters diameter. Scattered calcified arch atherosclerosis. Four vessel arch configuration with an aberrancy origin of the right subclavian artery. Tortuous proximal great vessels. Right carotid system: Dedicated right CCA origin off of the arch with tortuosity through the superior mediastinum and thoracic inlet. No proximal CCA stenosis. Widely patent right carotid bifurcation with mild calcified plaque. Severely tortuous and ectatic but widely patent  cervical right ICA to the skull base. Left carotid system: Calcified plaque at the left CCA origin not resulting in significant stenosis. Tortuous left CCA at the thoracic inlet and in the neck with a partially retropharyngeal course. Bulky calcified plaque at the left carotid bifurcation resulting in an estimated 50 % stenosis with respect to the distal vessel just proximal to the bifurcation. The left ICA origin and bulb then are widely patent. The cervical left ICA is severely tortuous and ectatic but patent to the skull base without additional stenosis. Vertebral  arteries: Aberrancy origin of the right subclavian artery with ectasia and plaque but no stenosis proximal to the right vertebral artery origin. The right vertebral artery origin is patent without stenosis. The right V1 segment is highly tortuous. The right V 2 segment is tortuous. The right vertebral is patent to the skull base without stenosis. No proximal left subclavian artery stenosis despite calcified plaque. Mild left subclavian tortuosity. Normal left vertebral artery origin. Highly tortuous left V1 and V2 segments. No left vertebral artery plaque or stenosis identified to the skull base. CTA HEAD Posterior circulation: Patent distal vertebral arteries without stenosis, the left is dominant. Patent bilateral PICA origins. Patent vertebrobasilar junction. Patent basilar artery with mild irregularity and tortuosity. Patent SCA origins. Fetal type bilateral PCA origins, more so the right. The proximal PCAs are patent without significant stenosis. The bilateral PCAs are regular, especially both inferior P3 divisions, but demonstrate preserved distal enhancement. Anterior circulation: Both ICA siphons are patent. There is left siphon ectasia and calcified plaque with only mild cavernous segment stenosis. The left ophthalmic and posterior communicating artery origins are normal. The right siphon is ectatic with mild plaque and no significant stenosis.  Normal right ophthalmic and posterior communicating artery origins. Patent carotid termini, MCA and ACA origins. Dominant left A1. Anterior communicating artery and bilateral ACA branches are within normal limits. Left MCA M1 segment and bifurcation are patent. The left MCA branches appear patent with only mild irregularity. The right MCA M1 segment is patent without stenosis. The right MCA bifurcation is patent, but there is asymmetric paucity of right MCA M2 and M3 branches suspicious for occlusion of a middle sylvian M2 branch, although no discretely occluded vessel is evident. The visible enhancing right MCA branches are irregular but patent without a proximal high-grade stenosis. Venous sinuses: Appear patent on these early arterial phase images. Anatomic variants: Aberrancy right subclavian artery origin. Dominant distal left vertebral artery. Dominant left ACA A1 segment. Review of the MIP images confirms the above findings IMPRESSION: 1. Constellation of findings suspicious for a right MCA M2 occlusion at the trifurcation, although no discrete occluded vessel is identified on the CTA. Some artifactual findings on CT perfusion are suspected, but CTP suggests a posterior right MCA territory penumbra with no core infarct detected. 2. The above was discussed by telephone with Dr. Kerney Elbe on 10/02/2017 at 1024 hours. 3. Underlying generalized Arterial Ectasia, including ectatic aortic arch (see #5), bilateral carotid arteries, and aberrant right subclavian artery. Atherosclerosis, but no hemodynamically significant stenosis in the neck or at the skull base. 4. Aortic Atherosclerosis (ICD10-I70.0) and Emphysema (ICD10-J43.9). 5. Aneurysmal descending thoracic aorta, 40 millimeters diameter. Recommend annual imaging followup by CTA or MRA. This recommendation follows 2010 ACCF/AHA/AATS/ACR/ASA/SCA/SCAI/SIR/STS/SVM Guidelines for the Diagnosis and Management of Patients with Thoracic Aortic Disease. Circulation.  2010; 121: [id number]. Electronically Signed   By: Genevie Roberson M.D.   On: 10/02/2017 10:46   Ct Angio Neck W Or Wo Contrast  Result Date: 10/02/2017 CLINICAL DATA:  84 year old female code stroke clinically with symptoms of right MCA thrombosis. EXAM: CT ANGIOGRAPHY HEAD AND NECK CT PERFUSION BRAIN TECHNIQUE: Multidetector CT imaging of the head and neck was performed using the standard protocol during bolus administration of intravenous contrast. Multiplanar CT image reconstructions and MIPs were obtained to evaluate the vascular anatomy. Carotid stenosis measurements (when applicable) are obtained utilizing NASCET criteria, using the distal internal carotid diameter as the denominator. Multiphase CT imaging of the brain was performed following IV bolus contrast injection. Subsequent  parametric perfusion maps were calculated using RAPID software. CONTRAST:  95 milliliters Isovue 370 COMPARISON:  Presentation noncontrast head CT 0957 hours today. FINDINGS: CT Brain Perfusion Findings: The study is somewhat limited by motion artifact occuring late in the data acquisition. And subsequently abnormal T-max in the posterior fossa is probably artifact. ASPECTS 10 at 0957 hours today. CBF (<30%) Volume: zero, no core infarct detected. Perfusion (Tmax>6.0s) volume: Evidence of penumbra in the posterior right MCA territory, volume less than 36 mL (considering the probably artifactual volume penumbra in the posterior fossa) Mismatch Volume: Not applicable Infarction Location:Not applicable CTA NECK Skeleton: Moderate and severe degenerative changes in the cervical spine including 3-4 millimeters of anterolisthesis of C3 on C4 and reversed cervical lordosis. Underlying thoracic scoliosis. Osteopenia. No acute osseous abnormality identified. Upper chest: Centrilobular emphysema with superimposed dependent pulmonary opacity in the left upper lobe and superior segment right lower lobe. The visible Major airways are patent. No  superior mediastinal lymphadenopathy. Other neck: Large left thyroid goiter with mild regional mass effect. Partially retropharyngeal course of both carotid arteries. Otherwise negative. No cervical lymphadenopathy. Aortic arch: Ectatic visible thoracic aorta with the aortic arch measuring about 36 millimeters diameter and the proximal descending thoracic aorta up to 40 millimeters diameter. Scattered calcified arch atherosclerosis. Four vessel arch configuration with an aberrancy origin of the right subclavian artery. Tortuous proximal great vessels. Right carotid system: Dedicated right CCA origin off of the arch with tortuosity through the superior mediastinum and thoracic inlet. No proximal CCA stenosis. Widely patent right carotid bifurcation with mild calcified plaque. Severely tortuous and ectatic but widely patent cervical right ICA to the skull base. Left carotid system: Calcified plaque at the left CCA origin not resulting in significant stenosis. Tortuous left CCA at the thoracic inlet and in the neck with a partially retropharyngeal course. Bulky calcified plaque at the left carotid bifurcation resulting in an estimated 50 % stenosis with respect to the distal vessel just proximal to the bifurcation. The left ICA origin and bulb then are widely patent. The cervical left ICA is severely tortuous and ectatic but patent to the skull base without additional stenosis. 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 The right vertebral artery origin is patent without stenosis. The right V1 segment is highly tortuous. The right V 2 segment is tortuous. The right vertebral is patent to the skull base without stenosis. No proximal left subclavian artery stenosis despite calcified plaque. Mild left subclavian tortuosity. Normal left vertebral artery origin. Highly tortuous left V1 and V2 segments. No left  vertebral artery plaque or stenosis identified to the skull base. CTA HEAD Posterior circulation: Patent distal vertebral arteries without stenosis, the left is dominant. Patent bilateral PICA origins. Patent vertebrobasilar junction. Patent basilar artery with mild irregularity and tortuosity. Patent SCA origins. Fetal type bilateral PCA origins, more so the right. The proximal PCAs are patent without significant stenosis. The bilateral PCAs are regular, especially both inferior P3 divisions, but demonstrate preserved distal enhancement. Anterior circulation: Both ICA siphons are patent. There is left siphon ectasia and calcified plaque with only mild cavernous segment stenosis. The left ophthalmic and posterior communicating artery origins are normal. The right siphon is ectatic with mild plaque and no significant stenosis. Normal right ophthalmic and posterior communicating artery origins. Patent carotid termini, MCA and ACA origins. Dominant left A1. Anterior communicating artery and bilateral ACA branches are within normal limits. Left  MCA M1 segment and bifurcation are patent. The left MCA branches appear patent with only mild irregularity. The right MCA M1 segment is patent without stenosis. The right MCA bifurcation is patent, but there is asymmetric paucity of right MCA M2 and M3 branches suspicious for occlusion of a middle sylvian M2 branch, although no discretely occluded vessel is evident. The visible enhancing right MCA branches are irregular but patent without a proximal high-grade stenosis. Venous sinuses: Appear patent on these early arterial phase images. Anatomic variants: Aberrancy right subclavian artery origin. Dominant distal left vertebral artery. Dominant left ACA A1 segment. Review of the MIP images confirms the above findings IMPRESSION: 1. Constellation of findings suspicious for a right MCA M2 occlusion at the trifurcation, although no discrete occluded vessel is identified on the CTA.  Some artifactual findings on CT perfusion are suspected, but CTP suggests a posterior right MCA territory penumbra with no core infarct detected. 2. The above was discussed by telephone with Dr. Kerney Elbe on 10/02/2017 at 1024 hours. 3. Underlying generalized Arterial Ectasia, including ectatic aortic arch (see #5), bilateral carotid arteries, and aberrant right subclavian artery. Atherosclerosis, but no hemodynamically significant stenosis in the neck or at the skull base. 4. Aortic Atherosclerosis (ICD10-I70.0) and Emphysema (ICD10-J43.9). 5. Aneurysmal descending thoracic aorta, 40 millimeters diameter. Recommend annual imaging followup by CTA or MRA. This recommendation follows 2010 ACCF/AHA/AATS/ACR/ASA/SCA/SCAI/SIR/STS/SVM Guidelines for the Diagnosis and Management of Patients with Thoracic Aortic Disease. Circulation. 2010; 121: [id number]. Electronically Signed   By: Genevie Roberson M.D.   On: 10/02/2017 10:46   Ct Cerebral Perfusion W Contrast  Result Date: 10/02/2017 CLINICAL DATA:  84 year old female code stroke clinically with symptoms of right MCA thrombosis. EXAM: CT ANGIOGRAPHY HEAD AND NECK CT PERFUSION BRAIN TECHNIQUE: Multidetector CT imaging of the head and neck was performed using the standard protocol during bolus administration of intravenous contrast. Multiplanar CT image reconstructions and MIPs were obtained to evaluate the vascular anatomy. Carotid stenosis measurements (when applicable) are obtained utilizing NASCET criteria, using the distal internal carotid diameter as the denominator. Multiphase CT imaging of the brain was performed following IV bolus contrast injection. Subsequent parametric perfusion maps were calculated using RAPID software. CONTRAST:  95 milliliters Isovue 370 COMPARISON:  Presentation noncontrast head CT 0957 hours today. FINDINGS: CT Brain Perfusion Findings: The study is somewhat limited by motion artifact occuring late in the data acquisition. And subsequently  abnormal T-max in the posterior fossa is probably artifact. ASPECTS 10 at 0957 hours today. CBF (<30%) Volume: zero, no core infarct detected. Perfusion (Tmax>6.0s) volume: Evidence of penumbra in the posterior right MCA territory, volume less than 36 mL (considering the probably artifactual volume penumbra in the posterior fossa) Mismatch Volume: Not applicable Infarction Location:Not applicable CTA NECK Skeleton: Moderate and severe degenerative changes in the cervical spine including 3-4 millimeters of anterolisthesis of C3 on C4 and reversed cervical lordosis. Underlying thoracic scoliosis. Osteopenia. No acute osseous abnormality identified. Upper chest: Centrilobular emphysema with superimposed dependent pulmonary opacity in the left upper lobe and superior segment right lower lobe. The visible Major airways are patent. No superior mediastinal lymphadenopathy. Other neck: Large left thyroid goiter with mild regional mass effect. Partially retropharyngeal course of both carotid arteries. Otherwise negative. No cervical lymphadenopathy. Aortic arch: Ectatic visible thoracic aorta with the aortic arch measuring about 36 millimeters diameter and the proximal descending thoracic aorta up to 40 millimeters diameter. Scattered calcified arch atherosclerosis. Four vessel arch configuration with an aberrancy origin of the right subclavian artery. Tortuous  proximal great vessels. Right carotid system: Dedicated right CCA origin off of the arch with tortuosity through the superior mediastinum and thoracic inlet. No proximal CCA stenosis. Widely patent right carotid bifurcation with mild calcified plaque. Severely tortuous and ectatic but widely patent cervical right ICA to the skull base. Left carotid system: Calcified plaque at the left CCA origin not resulting in significant stenosis. Tortuous left CCA at the thoracic inlet and in the neck with a partially retropharyngeal course. Bulky calcified plaque at the left  carotid bifurcation resulting in an estimated 50 % stenosis with respect to the distal vessel just proximal to the bifurcation. The left ICA origin and bulb then are widely patent. The cervical left ICA is severely tortuous and ectatic but patent to the skull base without additional stenosis. 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 The right vertebral artery origin is patent without stenosis. The right V1 segment is highly tortuous. The right V 2 segment is tortuous. The right vertebral is patent to the skull base without stenosis. No proximal left subclavian artery stenosis despite calcified plaque. Mild left subclavian tortuosity. Normal left vertebral artery origin. Highly tortuous left V1 and V2 segments. No left vertebral artery plaque or stenosis identified to the skull base. CTA HEAD Posterior circulation: Patent distal vertebral arteries without stenosis, the left is dominant. Patent bilateral PICA origins. Patent vertebrobasilar junction. Patent basilar artery with mild irregularity and tortuosity. Patent SCA origins. Fetal type bilateral PCA origins, more so the right. The proximal PCAs are patent without significant stenosis. The bilateral PCAs are regular, especially both inferior P3 divisions, but demonstrate preserved distal enhancement. Anterior circulation: Both ICA siphons are patent. There is left siphon ectasia and calcified plaque with only mild cavernous segment stenosis. The left ophthalmic and posterior communicating artery origins are normal. The right siphon is ectatic with mild plaque and no significant stenosis. Normal right ophthalmic and posterior communicating artery origins. Patent carotid termini, MCA and ACA origins. Dominant left A1. Anterior communicating artery and bilateral ACA branches are within normal limits. Left MCA M1 segment and bifurcation are patent. The  left MCA branches appear patent with only mild irregularity. The right MCA M1 segment is patent without stenosis. The right MCA bifurcation is patent, but there is asymmetric paucity of right MCA M2 and M3 branches suspicious for occlusion of a middle sylvian M2 branch, although no discretely occluded vessel is evident. The visible enhancing right MCA branches are irregular but patent without a proximal high-grade stenosis. Venous sinuses: Appear patent on these early arterial phase images. Anatomic variants: Aberrancy right subclavian artery origin. Dominant distal left vertebral artery. Dominant left ACA A1 segment. Review of the MIP images confirms the above findings IMPRESSION: 1. Constellation of findings suspicious for a right MCA M2 occlusion at the trifurcation, although no discrete occluded vessel is identified on the CTA. Some artifactual findings on CT perfusion are suspected, but CTP suggests a posterior right MCA territory penumbra with no core infarct detected. 2. The above was discussed by telephone with Dr. Kerney Elbe on 10/02/2017 at 1024 hours. 3. Underlying generalized Arterial Ectasia, including ectatic aortic arch (see #5), bilateral carotid arteries, and aberrant right subclavian artery. Atherosclerosis, but no hemodynamically significant stenosis in the neck or at the skull base. 4. Aortic Atherosclerosis (ICD10-I70.0) and Emphysema (ICD10-J43.9). 5. Aneurysmal descending thoracic aorta, 40 millimeters diameter. Recommend annual imaging followup by CTA or MRA. This recommendation  follows 2010 ACCF/AHA/AATS/ACR/ASA/SCA/SCAI/SIR/STS/SVM Guidelines for the Diagnosis and Management of Patients with Thoracic Aortic Disease. Circulation. 2010; 121: Z610-R604. Electronically Signed   By: Genevie Roberson M.D.   On: 10/02/2017 10:46   Ct Head Code Stroke Wo Contrast  Result Date: 10/02/2017 CLINICAL DATA:  Code stroke. 84 year old female with left side weakness, facial droop, rightward gaze. Last seen  normal 2100 hours yesterday. EXAM: CT HEAD WITHOUT CONTRAST TECHNIQUE: Contiguous axial images were obtained from the base of the skull through the vertex without intravenous contrast. COMPARISON:  None. FINDINGS: Brain: Cerebral volume is within normal limits for age. No ventriculomegaly. No midline shift, mass effect, or evidence of intracranial mass lesion. No acute intracranial hemorrhage identified. Widespread and confluent bilateral cerebral white matter hypodensity. Deep white matter capsule involvement. No superimposed acute cortically based infarct identified. Heterogeneity in the caudal left thalamus and midbrain. Asymmetric heterogeneity in the right pons. There are multiple small bilateral cerebellar infarcts, more numerous on the left. These are likely chronic. Vascular: Calcified atherosclerosis at the skull base. No suspicious intracranial vascular hyperdensity. Skull: No acute osseous abnormality identified. Sinuses/Orbits: Fluid level in the right maxillary sinus. Scattered ethmoid opacification. Other paranasal sinuses, the tympanic cavities, and mastoids are well pneumatized. Other: Only slight rightward gaze deviation on these images. No acute orbit or scalp soft tissue findings identified. ASPECTS [facility] Stroke Program Early CT Score) - Ganglionic level infarction (caudate, lentiform nuclei, internal capsule, insula, M1-M3 cortex): 7 - Supraganglionic infarction (M4-M6 cortex): 3 Total score (0-10 with 10 being normal): 10 IMPRESSION: 1. No acute cortically based infarct or acute intracranial hemorrhage identified. ASPECTS 10. 2. Chronic small vessel disease with age indeterminate involvement of the right pons. 3. Left greater than right cerebellar infarcts, probably chronic. 4. These results were communicated to Dr. Cheral Marker at 10:05 Brooks 4/13/2019by text page via the [facility] messaging system. Electronically Signed   By: Genevie Roberson M.D.   On: 10/02/2017 10:06    EKG: (Independently reviewed)  atrial fibrillation with ventricular rate 94 bpm, QTC 453 ms, voltage criteria met for left ventricular hypertrophy, early R wave rotation, nonspecific ST changes in inferior lateral leads consistent with early repolarization noting EKG unchanged from previous EKG August 2018  Assessment/Plan Principal Problem:   Acute CVA (cerebrovascular accident)/ History of cerebellar CVA (cerebrovascular accident) -Patient presents > 10 hours after LSN at 2100 on 4/12 noted with left-sided deficits and diagnostic imaging consistent with likely embolic stroke as noted above -Evaluated by neurologist and deemed not a candidate for advanced interventions -Unable to be fully anticoagulated in the context of chronic atrial fibrillation due to history of frequent falls and GI bleed in April 2017 -Patient was on baby aspirin prior to admission so will increase to full dose aspirin and add daily Protonix -Follow-up on CTA head and neck-no need to pursue carotid duplex -Echocardiogram -HgbA1c/FLP; was not on statin prior to admission-we will start atorvastatin 40 mg noting patient elderly, frail and low body weight -PT/OT/SLP evaluation -Frequent neurological checks -Permissive hypertension; treat SBP >/= 220 w/ IV hydralazine prn  Active Problems:   Acute dehydration -Patient appears quite hemoconcentrated.  In review of labs in Care Everywhere baseline hemoglobin prior to intramedullary nailing (07/2017) was between 9.5 and 10 with discharge hemoglobin after surgery 8.2 -Current hemoglobin 15 -Has an anion gap acidemia likely from dehydration in context of normal renal function -Normal saline at 100/hr -Patient denies issues with poor appetite, nausea, vomiting, diarrhea in the past several days although did report nausea today -  Elderly patient who may have detected UTI so will obtain urinalysis and culture    Atrial fibrillation  -Currently rate controlled on preadmission CCB and digoxin -Allowing for  permissive hypertension in context of acute stroke so we will hold CCB but continue digoxin -Not on anticoagulant secondary to frequent falls and history of GI bleed 10/15/15    COPD (chronic obstructive pulmonary disease)/Chronic respiratory failure with hypoxia  -Stopped smoking 25 years ago -Cont O2 at 2.5 L/min -Not on SABA or L ABA prior to admission    Hyperthyroidism -Continue methimazole    Frequent falls -Ongoing problem -PT/OT evaluation as above    Protein calorie malnutrition  -Obtain weight -Nutrition consultation     ? Chronic pain syndrome/left femoral intramedullary nailing February 2019 -Continue preadmission short acting oxycodone    Fe deficiency anemia -Continue ferrous gluconate -Iron level was 13 on 07/27/17 -Baseline hemoglobin as above     ? Chronic hypokalemia -Continue preadmission potassium supplementation    OSA (obstructive sleep apnea)    **Additional lab, imaging and/or diagnostic evaluation at discretion of supervising physician  DVT prophylaxis: Lovenox Code Status: Full Family Communication: No family at bedside Disposition Plan: SNF Consults called: Neurology/[place]    Ola Raap L. ANP-BC [facility] Pager [phone]   If 7PM-7AM, please contact night-coverage [email or web address] Password [email or web address]  10/02/2017, 1:05 PM

## 2017-10-03 ENCOUNTER — Inpatient Hospital Stay (HOSPITAL_COMMUNITY): Payer: Medicare Other

## 2017-10-03 ENCOUNTER — Encounter (HOSPITAL_COMMUNITY): Payer: Self-pay | Admitting: Radiology

## 2017-10-03 DIAGNOSIS — Z8673 Personal history of transient ischemic attack (TIA), and cerebral infarction without residual deficits: Secondary | ICD-10-CM

## 2017-10-03 DIAGNOSIS — E0590 Thyrotoxicosis, unspecified without thyrotoxic crisis or storm: Secondary | ICD-10-CM

## 2017-10-03 DIAGNOSIS — J449 Chronic obstructive pulmonary disease, unspecified: Secondary | ICD-10-CM

## 2017-10-03 DIAGNOSIS — I4891 Unspecified atrial fibrillation: Secondary | ICD-10-CM

## 2017-10-03 DIAGNOSIS — G4733 Obstructive sleep apnea (adult) (pediatric): Secondary | ICD-10-CM

## 2017-10-03 DIAGNOSIS — I351 Nonrheumatic aortic (valve) insufficiency: Secondary | ICD-10-CM

## 2017-10-03 DIAGNOSIS — J9611 Chronic respiratory failure with hypoxia: Secondary | ICD-10-CM

## 2017-10-03 DIAGNOSIS — D500 Iron deficiency anemia secondary to blood loss (chronic): Secondary | ICD-10-CM

## 2017-10-03 DIAGNOSIS — E43 Unspecified severe protein-calorie malnutrition: Secondary | ICD-10-CM

## 2017-10-03 LAB — ECHOCARDIOGRAM COMPLETE
Height: 64 in
Weight: 1615.53 oz

## 2017-10-03 LAB — LIPID PANEL
CHOL/HDL RATIO: 3.5 ratio
Cholesterol: 209 mg/dL — ABNORMAL HIGH (ref 0–200)
HDL: 60 mg/dL (ref >40)
LDL CALC: 120 mg/dL — AB (ref 0–99)
TRIGLYCERIDES: 145 mg/dL (ref <150)
VLDL: 29 mg/dL (ref 0–40)

## 2017-10-03 LAB — BASIC METABOLIC PANEL
Anion gap: 11 (ref 5–15)
BUN: 6 mg/dL (ref 6–20)
CO2: 20 mmol/L — ABNORMAL LOW (ref 22–32)
CREATININE: 0.73 mg/dL (ref 0.44–1.00)
Calcium: 8.2 mg/dL — ABNORMAL LOW (ref 8.9–10.3)
Chloride: 106 mmol/L (ref 101–111)
GFR calc Af Amer: >60 mL/min (ref >60)
GLUCOSE: 113 mg/dL — AB (ref 65–99)
Potassium: 3.7 mmol/L (ref 3.5–5.1)
SODIUM: 137 mmol/L (ref 135–145)

## 2017-10-03 LAB — CBC
HCT: 43.9 % (ref 36.0–46.0)
Hemoglobin: 13.6 g/dL (ref 12.0–15.0)
MCH: 27.8 pg (ref 26.0–34.0)
MCHC: 31 g/dL (ref 30.0–36.0)
MCV: 89.6 fL (ref 78.0–100.0)
PLATELETS: 276 10*3/uL (ref 150–400)
RBC: 4.9 MIL/uL (ref 3.87–5.11)
RDW: 21.6 % — ABNORMAL HIGH (ref 11.5–15.5)
WBC: 9.6 10*3/uL (ref 4.0–10.5)

## 2017-10-03 LAB — URINE CULTURE

## 2017-10-03 LAB — MRSA PCR SCREENING: MRSA by PCR: POSITIVE — AB

## 2017-10-03 LAB — HEMOGLOBIN A1C
Hgb A1c MFr Bld: 4.8 % (ref 4.8–5.6)
Mean Plasma Glucose: 91.06 mg/dL

## 2017-10-03 LAB — TSH: TSH: 1.839 u[IU]/mL (ref 0.350–4.500)

## 2017-10-03 NOTE — Evaluation (Signed)
Physical Therapy Evaluation Patient Details Name: Madison Roberson MRN: [id number] DOB: 08/14/1929 Today's Date: 10/03/2017   History of Present Illness  Pt is an 84 y.o. 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 Pt presented to the ED after being found down at SNF with L sided weakness, L facial droop, and L neglect. CT perfusion revealed likely embolic R MCA CVA with M2 occlusion.   Clinical Impression  Pt admitted with above diagnosis. Pt currently with functional limitations due to the deficits listed below (see PT Problem List). Patient cooperative and follows simple commands with delay at times. Pt will benefit from skilled PT to increase their independence and safety with mobility to allow discharge to the venue listed below.       Follow Up Recommendations SNF    Equipment Recommendations  None recommended by PT    Recommendations for Other Services       Precautions / Restrictions Precautions Precautions: Fall Restrictions Weight Bearing Restrictions: Yes LLE Weight Bearing: Weight bearing as tolerated Other Position/Activity Restrictions: No orders for WB status. MD office note 08/20/17 states WBAT.       Mobility  Bed Mobility Overal bed mobility: Needs Assistance Bed Mobility: Rolling;Sidelying to Sit Rolling: Min assist Sidelying to sit: Mod assist   Sit to supine: Max assist   General bed mobility comments: Mod assist to press [facility]  Transfers Overall transfer level: Needs assistance Equipment used: Rolling walker (2 wheeled) Transfers: Sit to/from [employer];Stand Pivot Transfers Sit to  Stand: Min assist;Mod assist(by 3rd transfer mod assist) Stand pivot transfers: Min assist(with RW)      Lateral/Scoot Transfers: Min guard General transfer comment: initially maintained left hand on RW once placed (moved R[facility] wihtout assist [facility] step [facility]  Ambulation/Gait Ambulation/Gait assistance: Mod assist Ambulation Distance (Feet): 9 Feet(around end of bed) Assistive device: Rolling walker (2 wheeled) Gait Pattern/deviations: Decreased step length - right;Decreased step length - left;Decreased stance time - left;Decreased weight shift to right;Staggering left;Trunk flexed     General Gait Details: initially advancing LLE without assist, by the end required assist to weight shift and advance LLE  Stairs            Wheelchair Mobility    Modified Rankin (Stroke Patients Only) Modified Rankin (Stroke Patients Only) Pre-Morbid Rankin Score: Moderately severe disability Modified Rankin: Moderately severe disability     Balance Overall balance assessment: Needs assistance Sitting-balance support: No upper extremity supported;Feet supported Sitting balance-Leahy Scale: Fair Sitting balance - Comments: Supervision at EOB   Standing balance support: Bilateral upper extremity supported Standing balance-Leahy Scale: Poor Standing balance comment: with fatigue leans to her left                             Pertinent Vitals/Pain Pain Assessment: No/denies pain    Home Living Family/patient expects to be discharged to:: Skilled nursing facility               Home Equipment: Walker - 2 wheels Additional Comments: Pt likely with other equipment at home but difficult to obtain some aspects of history due to confusion. Thinks she currently  lives with her daughter or son.     Prior Function Level of Independence: Needs assistance   Gait / Transfers Assistance Needed: Uses RW or is pushed in w/c per pt report.  ADL's / Homemaking Assistance Needed:  Pt reports having assistance for ADL from staff.   Comments: Question reliability of pt report.     Hand Dominance        Extremity/Trunk Assessment   Upper Extremity Assessment Upper Extremity Assessment: Defer to OT evaluation LUE Deficits / Details: Decreased attention to L side of body. Assist to place left hand on RW and then slips off after ~1-2 minutes without awareness LUE Coordination: decreased fine motor;decreased gross motor    Lower Extremity Assessment Lower Extremity Assessment: LLE deficits/detail;Generalized weakness LLE Deficits / Details: hip flexion 2+, knee extension 3+ LLE Coordination: decreased gross motor(decr placement of LLE)       Communication   Communication: No difficulties  Cognition Arousal/Alertness: Lethargic Behavior During Therapy: WFL for tasks assessed/performed Overall Cognitive Status: No family/caregiver present to determine baseline cognitive functioning Area of Impairment: Orientation;Attention;Following commands;Safety/judgement                 Orientation Level: Disoriented to;Time;Situation;Place Current Attention Level: Focused   Following Commands: Follows one step commands consistently;Follows one step commands with increased time Safety/Judgement: Decreased awareness of safety;Decreased awareness of deficits(decr awareness of lt side)   Problem Solving: Slow processing;Difficulty sequencing;Requires verbal cues General Comments: Patient stating she needed to use bathroom (+voided in [facility]). Able to problem-solve to turn her walker and step sideways around end of bed because space too narrow      General Comments      Exercises     Assessment/Plan    PT Assessment Patient needs continued PT services  PT Problem List Decreased strength;Decreased activity tolerance;Decreased balance;Decreased mobility;Decreased coordination;Decreased cognition;Decreased knowledge of use of DME;Decreased safety awareness;Decreased  knowledge of precautions       PT Treatment Interventions DME instruction;Gait training;Functional mobility training;Therapeutic activities;Therapeutic exercise;Balance training;Neuromuscular re-education;Cognitive remediation;Patient/family education    PT Goals (Current goals can be found in the Care Plan section)  Acute Rehab PT Goals Patient Stated Goal: be warm; get stronger PT Goal Formulation: With patient Time For Goal Achievement: 10/17/17 Potential to Achieve Goals: Good    Frequency Min 3X/week   Barriers to discharge Decreased caregiver support from SNF; not sure living situation prior to recent hip fracture    Co-evaluation               AM-PAC PT "6 Clicks" Daily Activity  Outcome Measure Difficulty turning over in bed (including adjusting bedclothes, sheets and blankets)?: A Little Difficulty moving from lying on back to sitting on the side of the bed? : A Lot Difficulty sitting down on and standing up from a chair with arms (e.g., wheelchair, bedside commode, etc,.)?: A Lot Help needed moving to and from a bed to chair (including a wheelchair)?: A Lot Help needed walking in hospital room?: A Lot Help needed climbing 3-5 steps with a railing? : Total 6 Click Score: 12    End of Session Equipment Utilized During Treatment: Gait belt;Oxygen Activity Tolerance: Patient limited by fatigue Patient left: in chair;with call bell/phone within reach;with chair alarm set;Other (comment)(PA in to assess pt) Nurse Communication: Mobility status;Weight bearing status PT Visit Diagnosis: Unsteadiness on feet (R26.81);Other abnormalities of gait and mobility (R26.89);History of falling (Z91.81);Hemiplegia and hemiparesis Hemiplegia - Right/Left: Left Hemiplegia - caused by: Cerebral infarction    Time:  4098-1191 PT Time Calculation (min) (ACUTE ONLY): 33 min   Charges:   PT Evaluation $PT Eval Moderate Complexity: 1 Mod PT Treatments $Gait Training: 8-22 mins    PT G Codes:          Madison Roberson,PT 10/03/2017, 10:14 AM

## 2017-10-03 NOTE — Progress Notes (Signed)
  Echocardiogram 2D Echocardiogram has been performed.  Madison Roberson 10/03/2017, 1:28 PM

## 2017-10-03 NOTE — Progress Notes (Signed)
STROKE TEAM PROGRESS NOTE   HISTORY OF PRESENT ILLNESS (per record) Madison Roberson is an 84 y.o. female presenting from her SNF after staff found her to be hemiplegic on the left and with slurred speech this morning. LKN was 2100. On EMS arrival her BP was 138/91. Per EMS, she was drooling on the left, had left facial droop, not moving or paying attention to her left side. She was in a-fib at the time of EMS assessment with HR as high as 170, which later decreased to 100. CBG was 174. She endorsed no symptoms except for SOB and cough. No headache.   LSN: [id number] tPA Given: No: Out of time window   SUBJECTIVE (INTERVAL HISTORY) Daughter is at the bedside. The patient was alert, awake and conversing well. She seems to be hard of hearing. She readily answered most questions. Her daughter said she had recent fall and s/p left hip placement, working with PT, much improved and able to walk with walker. Her Xarelto was stopped after the fall by her orthopedics. She was on ASA PTA. She stated that she was feeling some better today, her left side weakness much improved. However, she still has left neglect and hemianopia with right gaze preference. She passed swallow and on diet and plavix. MRI still pending.    OBJECTIVE Temp:  [97.4 F (36.3 C)-98.1 F (36.7 C)] 97.5 F (36.4 C) (04/14 0401) Pulse Rate:  [78-129] 108 (04/14 0401) Cardiac Rhythm: Atrial fibrillation (04/14 0151) Resp:  [15-29] 18 (04/14 0401) BP: (130-158)/(85-101) 148/85 (04/14 0401) SpO2:  [89 %-100 %] 97 % (04/14 0401) Weight:  [100 lb 15.5 oz (45.8 kg)] 100 lb 15.5 oz (45.8 kg) (04/13 1810)  CBC:  Recent Labs  Lab 10/02/17 0951 10/02/17 0958  WBC  --  10.8*  NEUTROABS  --  8.0*  HGB 17.0* 15.2*  HCT 50.0* 48.8*  MCV  --  89.5  PLT  --  299    Basic Metabolic Panel:  Recent Labs  Lab 10/02/17 0951 10/02/17 0958  NA 141 139  K 4.0 3.9  CL 106 105  CO2  --  18*  GLUCOSE 148* 148*  BUN 10 9  CREATININE 0.70 0.82   CALCIUM  --  8.9    Lipid Panel: No results found for: CHOL, TRIG, HDL, CHOLHDL, VLDL, LDLCALC HgbA1c: No results found for: HGBA1C Urine Drug Screen: No results found for: LABOPIA, COCAINSCRNUR, LABBENZ, AMPHETMU, THCU, LABBARB  Alcohol Level No results found for: [facility]   IMAGING I have personally reviewed the radiological images below and agree with the radiology interpretations.  Ct Angio Head W Or Wo Contrast Ct Angio Neck W Or Wo Contrast Ct Cerebral Perfusion W Contrast 10/02/2017 IMPRESSION:  1. Constellation of findings suspicious for a right MCA M2 occlusion at the trifurcation, although no discrete occluded vessel is identified on the CTA. Some artifactual findings on CT perfusion are suspected, but CTP suggests a posterior right MCA territory penumbra with no core infarct detected.  2. The above was discussed by telephone with Dr. Caryl Pina on 10/02/2017 at 1024 hours.  3. Underlying generalized Arterial Ectasia, including ectatic aortic arch (see #5), bilateral carotid arteries, and aberrant right subclavian artery. Atherosclerosis, but no hemodynamically significant stenosis in the neck or at the skull base.  4. Aortic Atherosclerosis (ICD10-I70.0) and Emphysema (ICD10-J43.9).  5. Aneurysmal descending thoracic aorta, 40 millimeters diameter. Recommend annual imaging followup by CTA or MRA.   Ct Head Code Stroke Wo Contrast 10/02/2017 IMPRESSION:  1. No acute cortically based infarct or acute intracranial hemorrhage identified. ASPECTS 10.  2. Chronic small vessel disease with age indeterminate involvement of the right pons.  3. Left greater than right cerebellar infarcts, probably chronic.   Transthoracic Echocardiogram - Left ventricle: The cavity size was normal. There was severe   focal basal hypertrophy of the septum. Systolic function was   mildly to moderately reduced. The estimated ejection fraction was   in the range of 40% to 45%. Hypokinesis of the  anteroseptal and   inferoseptal myocardium. - Aortic valve: There was moderate regurgitation. Valve area   (Vmax): 1.61 cm^2. - Aorta: Ascending aortic diameter: 3.6 mm (S). - Ascending aorta: The ascending aorta was at the upper limit of   normal. - Mitral valve: Calcified annulus. Transvalvular velocity was   within the normal range. There was no evidence for stenosis.   There was mild regurgitation. Valve area by pressure half-time: 2   cm^2. - Left atrium: The atrium was severely dilated. - Right ventricle: The cavity size was normal. Wall thickness was   normal. Systolic function was normal. - Right atrium: The atrium was severely dilated. - Atrial septum: No defect or patent foramen ovale was identified. - Tricuspid valve: There was moderate regurgitation. - Pulmonary arteries: Systolic pressure was mildly increased. PA   peak pressure: 44 mm Hg (S).  MRI pending    PHYSICAL EXAM Vitals:   10/02/17 1833 10/02/17 2034 10/02/17 2341 10/03/17 0401  BP: (!) 143/94 (!) 141/92 130/86 (!) 148/85  Pulse: 98 84 87 (!) 108  Resp: 20 18 18 18   Temp: 97.9 F (36.6 C) (!) 97.4 F (36.3 C) 98.1 F (36.7 C) (!) 97.5 F (36.4 C)  TempSrc: Oral Oral Oral Oral  SpO2: 96% 99% (!) 89% 97%  Weight:      Height:       General - pleasant somewhat hard of hearing 84 year old female in no acute distress. Heart - irregularly irregular rhythm - no murmer appreciated Lungs - Clear to auscultation anteriorly Abdomen - Soft - non tender Extremities - Distal pulses intact - no edema Skin - Warm and dry  Mental Status: Alert, oriented to person and place. She knew that it was April but thought the date was 2020.  Thought content with most part is appropriate.  Very mild dysarthria without evidence of aphasia.  Able to follow 2 step commands with cueing. Left neglect. Cranial Nerves: II: Discs not visualized; left hemianopia, pupils equal, round, reactive to light.  III,IV, VI: ptosis not  present, extra-ocular motions intact bilaterally V,VII: left facial droop, facial light touch sensation normal bilaterally VIII: hard of hearing bilaterally IX,X: gag reflex present XI: bilateral shoulder shrug intact. XII: midline tongue extension Motor: RUE - 4/5    LUE - 3/5 RLE - 4/5    LLE -  3-/5 Tone and bulk:normal tone throughout; no atrophy noted Sensory: Light touch intact throughout, bilaterally Cerebellar: normal finger-to-nose right RUE and slow LUE Gait: not tested    ASSESSMENT/PLAN Ms. Zeynep Fantroy is a 84 y.o. 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 She did not receive IV t-PA due to late presentation.  Stroke: right MCA infarct due to afib not on AC   Resultant - left hemiparesis, and left neglect  CT head -  no acute infarct. Left greater than right cerebellar infarcts, probably chronic.   MRI head - pending  CTA H&N - but no large vessel occlusion found  CTP - right MCA territory penumbra but no core infarct  Carotid Doppler - CTA neck  2D Echo - EF 40-45%  LDL 120  HgbA1c - 4.8  VTE prophylaxis - lovenox  Fall precautions  Diet Heart Room service appropriate? Yes; Fluid consistency: Thin  aspirin 81 mg daily and Xarelto (rivaroxaban) daily prior to admission, now on clopidogrel 75 mg daily. Discussed with pt and daughter, since pt at this time no fall risk, will consider eliquis (given previous GIB) if MRI did not show large infarct. Once eliquis starts, plavix can be discontinued.  Ongoing aggressive stroke risk factor management  Therapy recommendations:  SNF  Disposition:  Pending  Afib  Was on Xarelto  Stopped after recent fall s/p left hip placement  On ASA PTA  Now on  plavix  Discussed with pt and daughter, since pt at this time no fall risk, will consider eliquis (given previous GIB) if MRI did not show large infarct. Once eliquis starts, plavix can be discontinued.  Hypertension  Stable . Permissive hypertension (OK if < 220/120) but gradually normalize in 5-7 days . Long-term BP goal normotensive  Hyperlipidemia  Lipid lowering medication PTA:  none  LDL 120, goal < 70  Current lipid lowering medication: Lipitor 40 mg daily  Continue statin at discharge   Other Stroke Risk Factors  Advanced age  Former cigarette smoker - quit 25 years ago  Hx stroke/TIA  Obstructive sleep apnea  Other Active Problems  HOH  Mild confusion  Contact precautions - MRSA  Previous GI bleed history in 2017  Previous DVT history  History of frequent falls  COPD / emphysema  Mild leukocytosis - afebrile   Aneurysmal descending thoracic aorta, 40 millimeters diameter. Recommend annual imaging followup by CTA or MRA   Hospital day # 1  Marvel PlanJindong Algernon Mundie, MD PhD Stroke Neurology 10/03/2017 4:15 PM    To contact Stroke Continuity provider, please refer to [email or web address]. After hours, contact General Neurology

## 2017-10-03 NOTE — Progress Notes (Signed)
PROGRESS NOTE    Madison Roberson  WUJ:[id number] DOB: 1930/02/29 DOA: 10/02/2017 PCP: Olen Cordial, MD      Brief Narrative:  [name] is an 84 y.o. F with COPD on home O2, hyperthyroidism on methimazole, anemia, hx crebellar stroke, and Afib not on AC due to GIB in 2017 and frequent falls who presents with left sided weakness, left facial droop and neglect.   Assessment & Plan:  Likely acute CVA, presumed embolic, posterior right MCA territory History of stroke History of femoral arterial embolism s/p embolectomy x2 MRI pending.  CTA and CT perfusion study shows: "Constellation of findings suspicious for a right MCA M2 occlusion at the trifurcation, although no discrete occluded vessel is identified on the CTA. Some artifactual findings on CT perfusion are suspected, but CTP suggests a posterior right MCA territory penumbra with no core infarct detected." -Continue Plavix -Continue statin -Lipids, hemoglobin A1c pending -Carotid imaging with CTA showed ectatic carotids but no stenosis >50% -Echocardiogram pending -PT/OT/SLP have been consulted -Consult to Neurology, appreciate recommendations -VTE prophylaxis ordered at time of admission -Atrial fibrillation: not on AC due to chronic blood loss anemia (no frank GI bleed, and frequent injurious falls) -tPA not given because of no LVO -Dysphagia screen ordered in ER -No smoking   Permanent atrial fibrillation CHA2DS2-Vasc 7 (hx dCHF, age, gender, history stroke, vascular disease/femoral embolism).  Confers 11% risk of stroke per year, 15% risk of stroke or systemic embolism.   -Continue diltiazem -Continue digoxin -To me, the benefits of continued anticoagulation outweigh the risk from falls  COPD Chronic respiratory failure on Home O2 2.5L No active disese -Continue home O2  Hyperthyroidism -Continue methimazole  Dehydration -Follow AM BMP, adjust fluids as needed  Chronic blood loss anemia -Continue  iron  OSA  Severe protein calorie malnutrition -Consult dietitian  Other medications -Continue mirtazapine, fluoxetine -Continue PPI -Continue potassium       DVT prophylaxis: Lovenox Code Status: FULL Family Communication: None present MDM and disposition Plan: The below labs and imaging reports were reviewed.  The patient presented with left hemiparesis, found to have new stroke on CT.  Her status is clinically stable.     Consultants:   Neurology  Procedures:   Echocardiogram     Subjective: She is having unchanged left sided weakness, no confusion, change in mental status.  No fever, cough, dysuria.  Objective: Vitals:   10/02/17 1833 10/02/17 2034 10/02/17 2341 10/03/17 0401  BP: (!) 143/94 (!) 141/92 130/86 (!) 148/85  Pulse: 98 84 87 (!) 108  Resp: 20 18 18 18   Temp: 97.9 F (36.6 C) (!) 97.4 F (36.3 C) 98.1 F (36.7 C) (!) 97.5 F (36.4 C)  TempSrc: Oral Oral Oral Oral  SpO2: 96% 99% (!) 89% 97%  Weight:      Height:        Intake/Output Summary (Last 24 hours) at 10/03/2017 0719 Last data filed at 10/02/2017 1752 Gross per 24 hour  Intake -  Output 650 ml  Net -650 ml   Filed Weights   10/02/17 1810  Weight: 45.8 kg (100 lb 15.5 oz)    Examination: General appearance: Elderly adult female, alert and in no acute distress.  Sitting in recliner.  Lunch partially eaten in front of her. HEENT: Anicteric, conjunctiva pink, lids and lashes normal. No nasal deformity, discharge, epistaxis.  Lips dry, dentition good, OP dry, no oral lesions, hearing diminished.   Skin: Warm and dry.  No jaundice.  No suspicious rashes or  lesions. Cardiac: RRR, nl S1-S2, soft sys murmur.  Capillary refill is brisk.  No LE edema.  Radial pulses 2+ and symmetric. Respiratory: Normal respiratory rate and rhythm.  CTAB without rales or wheezes. Abdomen: Abdomen soft.  No TTP. No ascites, distension, hepatosplenomegaly.   MSK: No deformities or effusions in upper or  lower extremity large joints.  Diffuse loss of subcutaneous muscle mass and fat. Neuro: Awake and alert.  EOMI, cranial nerves 3-12 intact.  Left arm weakness, but able to lift arm and leg somewhat.  Speech slow and halting.    Psych: Sensorium intact and responding to questions, but psychomotor slowing noted, attention diminished, affect flat, judgment and insight appear impaired.    Data Reviewed: I have personally reviewed following labs and imaging studies:  CBC: Recent Labs  Lab 10/02/17 0951 10/02/17 0958  WBC  --  10.8*  NEUTROABS  --  8.0*  HGB 17.0* 15.2*  HCT 50.0* 48.8*  MCV  --  89.5  PLT  --  299   Basic Metabolic Panel: Recent Labs  Lab 10/02/17 0951 10/02/17 0958  NA 141 139  K 4.0 3.9  CL 106 105  CO2  --  18*  GLUCOSE 148* 148*  BUN 10 9  CREATININE 0.70 0.82  CALCIUM  --  8.9   GFR: Estimated Creatinine Clearance: 35.6 mL/min (by C-G formula based on SCr of 0.82 mg/dL). Liver Function Tests: Recent Labs  Lab 10/02/17 0958  AST 22  ALT 9*  ALKPHOS 163*  BILITOT 0.7  PROT 7.1  ALBUMIN 3.7   No results for input(s): LIPASE, AMYLASE in the last 168 hours. No results for input(s): AMMONIA in the last 168 hours. Coagulation Profile: Recent Labs  Lab 10/02/17 0958  INR 1.00   Cardiac Enzymes: No results for input(s): CKTOTAL, CKMB, CKMBINDEX, TROPONINI in the last 168 hours. BNP (last 3 results) No results for input(s): PROBNP in the last 8760 hours. HbA1C: No results for input(s): HGBA1C in the last 72 hours. CBG: Recent Labs  Lab 10/02/17 0946  GLUCAP 130*   Lipid Profile: No results for input(s): CHOL, HDL, LDLCALC, TRIG, CHOLHDL, LDLDIRECT in the last 72 hours. Thyroid Function Tests: No results for input(s): TSH, T4TOTAL, FREET4, T3FREE, THYROIDAB in the last 72 hours. Anemia Panel: No results for input(s): VITAMINB12, FOLATE, FERRITIN, TIBC, IRON, RETICCTPCT in the last 72 hours. Urine analysis:    Component Value Date/Time    COLORURINE STRAW (A) 10/02/2017 1225   APPEARANCEUR CLEAR 10/02/2017 1225   LABSPEC 1.039 (H) 10/02/2017 1225   PHURINE 7.0 10/02/2017 1225   GLUCOSEU NEGATIVE 10/02/2017 1225   HGBUR NEGATIVE 10/02/2017 1225   BILIRUBINUR NEGATIVE 10/02/2017 1225   BILIRUBINUR negative 01/25/2017 1620   KETONESUR NEGATIVE 10/02/2017 1225   PROTEINUR NEGATIVE 10/02/2017 1225   UROBILINOGEN 0.2 01/25/2017 1620   NITRITE NEGATIVE 10/02/2017 1225   LEUKOCYTESUR NEGATIVE 10/02/2017 1225   Sepsis Labs: @LABRCNTIP (procalcitonin:4,lacticacidven:4)  ) Recent Results (from the past 240 hour(s))  MRSA PCR Screening     Status: Abnormal   Collection Time: 10/02/17 11:15 PM  Result Value Ref Range Status   MRSA by PCR POSITIVE (A) NEGATIVE Final    Comment:        The GeneXpert MRSA Assay (FDA approved for NASAL specimens only), is one component of a comprehensive MRSA colonization surveillance program. It is not intended to diagnose MRSA infection nor to guide or monitor treatment for MRSA infections. RESULT CALLED TO, READ BACK BY AND VERIFIED WITH: WORTH,S  RN [phone] 10/03/17 MITCHELL,L          Radiology Studies: Ct Angio Head W Or Wo Contrast  Result Date: 10/02/2017 CLINICAL DATA:  84 year old female code stroke clinically with symptoms of right MCA thrombosis. EXAM: CT ANGIOGRAPHY HEAD AND NECK CT PERFUSION BRAIN TECHNIQUE: Multidetector CT imaging of the head and neck was performed using the standard protocol during bolus administration of intravenous contrast. Multiplanar CT image reconstructions and MIPs were obtained to evaluate the vascular anatomy. Carotid stenosis measurements (when applicable) are obtained utilizing NASCET criteria, using the distal internal carotid diameter as the denominator. Multiphase CT imaging of the brain was performed following IV bolus contrast injection. Subsequent parametric perfusion maps were calculated using RAPID software. CONTRAST:  95 milliliters Isovue  370 COMPARISON:  Presentation noncontrast head CT 0957 hours today. FINDINGS: CT Brain Perfusion Findings: The study is somewhat limited by motion artifact occuring late in the data acquisition. And subsequently abnormal T-max in the posterior fossa is probably artifact. ASPECTS 10 at 0957 hours today. CBF (<30%) Volume: zero, no core infarct detected. Perfusion (Tmax>6.0s) volume: Evidence of penumbra in the posterior right MCA territory, volume less than 36 mL (considering the probably artifactual volume penumbra in the posterior fossa) Mismatch Volume: Not applicable Infarction Location:Not applicable CTA NECK Skeleton: Moderate and severe degenerative changes in the cervical spine including 3-4 millimeters of anterolisthesis of C3 on C4 and reversed cervical lordosis. Underlying thoracic scoliosis. Osteopenia. No acute osseous abnormality identified. Upper chest: Centrilobular emphysema with superimposed dependent pulmonary opacity in the left upper lobe and superior segment right lower lobe. The visible Major airways are patent. No superior mediastinal lymphadenopathy. Other neck: Large left thyroid goiter with mild regional mass effect. Partially retropharyngeal course of both carotid arteries. Otherwise negative. No cervical lymphadenopathy. Aortic arch: Ectatic visible thoracic aorta with the aortic arch measuring about 36 millimeters diameter and the proximal descending thoracic aorta up to 40 millimeters diameter. Scattered calcified arch atherosclerosis. Four vessel arch configuration with an aberrancy origin of the right subclavian artery. Tortuous proximal great vessels. Right carotid system: Dedicated right CCA origin off of the arch with tortuosity through the superior mediastinum and thoracic inlet. No proximal CCA stenosis. Widely patent right carotid bifurcation with mild calcified plaque. Severely tortuous and ectatic but widely patent cervical right ICA to the skull base. Left carotid system:  Calcified plaque at the left CCA origin not resulting in significant stenosis. Tortuous left CCA at the thoracic inlet and in the neck with a partially retropharyngeal course. Bulky calcified plaque at the left carotid bifurcation resulting in an estimated 50 % stenosis with respect to the distal vessel just proximal to the bifurcation. The left ICA origin and bulb then are widely patent. The cervical left ICA is severely tortuous and ectatic but patent to the skull base without additional stenosis. 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 The right vertebral artery origin is patent without stenosis. The right V1 segment is highly tortuous. The right V 2 segment is tortuous. The right vertebral is patent to the skull base without stenosis. No proximal left subclavian artery stenosis despite calcified plaque. Mild left subclavian tortuosity. Normal left vertebral artery origin. Highly tortuous left V1 and V2 segments. No left vertebral artery plaque or stenosis identified to the skull base. CTA HEAD Posterior circulation: Patent distal vertebral arteries without stenosis, the left is dominant. Patent bilateral PICA origins. Patent vertebrobasilar junction.  Patent basilar artery with mild irregularity and tortuosity. Patent SCA origins. Fetal type bilateral PCA origins, more so the right. The proximal PCAs are patent without significant stenosis. The bilateral PCAs are regular, especially both inferior P3 divisions, but demonstrate preserved distal enhancement. Anterior circulation: Both ICA siphons are patent. There is left siphon ectasia and calcified plaque with only mild cavernous segment stenosis. The left ophthalmic and posterior communicating artery origins are normal. The right siphon is ectatic with mild plaque and no significant stenosis. Normal right ophthalmic and posterior communicating artery  origins. Patent carotid termini, MCA and ACA origins. Dominant left A1. Anterior communicating artery and bilateral ACA branches are within normal limits. Left MCA M1 segment and bifurcation are patent. The left MCA branches appear patent with only mild irregularity. The right MCA M1 segment is patent without stenosis. The right MCA bifurcation is patent, but there is asymmetric paucity of right MCA M2 and M3 branches suspicious for occlusion of a middle sylvian M2 branch, although no discretely occluded vessel is evident. The visible enhancing right MCA branches are irregular but patent without a proximal high-grade stenosis. Venous sinuses: Appear patent on these early arterial phase images. Anatomic variants: Aberrancy right subclavian artery origin. Dominant distal left vertebral artery. Dominant left ACA A1 segment. Review of the MIP images confirms the above findings IMPRESSION: 1. Constellation of findings suspicious for a right MCA M2 occlusion at the trifurcation, although no discrete occluded vessel is identified on the CTA. Some artifactual findings on CT perfusion are suspected, but CTP suggests a posterior right MCA territory penumbra with no core infarct detected. 2. The above was discussed by telephone with Dr. Caryl Pina on 10/02/2017 at 1024 hours. 3. Underlying generalized Arterial Ectasia, including ectatic aortic arch (see #5), bilateral carotid arteries, and aberrant right subclavian artery. Atherosclerosis, but no hemodynamically significant stenosis in the neck or at the skull base. 4. Aortic Atherosclerosis (ICD10-I70.0) and Emphysema (ICD10-J43.9). 5. Aneurysmal descending thoracic aorta, 40 millimeters diameter. Recommend annual imaging followup by CTA or MRA. This recommendation follows 2010 ACCF/AHA/AATS/ACR/ASA/SCA/SCAI/SIR/STS/SVM Guidelines for the Diagnosis and Management of Patients with Thoracic Aortic Disease. Circulation. 2010; 121: Z610-R604. Electronically Signed   By: Odessa Fleming  M.D.   On: 10/02/2017 10:46   Ct Angio Neck W Or Wo Contrast  Result Date: 10/02/2017 CLINICAL DATA:  84 year old female code stroke clinically with symptoms of right MCA thrombosis. EXAM: CT ANGIOGRAPHY HEAD AND NECK CT PERFUSION BRAIN TECHNIQUE: Multidetector CT imaging of the head and neck was performed using the standard protocol during bolus administration of intravenous contrast. Multiplanar CT image reconstructions and MIPs were obtained to evaluate the vascular anatomy. Carotid stenosis measurements (when applicable) are obtained utilizing NASCET criteria, using the distal internal carotid diameter as the denominator. Multiphase CT imaging of the brain was performed following IV bolus contrast injection. Subsequent parametric perfusion maps were calculated using RAPID software. CONTRAST:  95 milliliters Isovue 370 COMPARISON:  Presentation noncontrast head CT 0957 hours today. FINDINGS: CT Brain Perfusion Findings: The study is somewhat limited by motion artifact occuring late in the data acquisition. And subsequently abnormal T-max in the posterior fossa is probably artifact. ASPECTS 10 at 0957 hours today. CBF (<30%) Volume: zero, no core infarct detected. Perfusion (Tmax>6.0s) volume: Evidence of penumbra in the posterior right MCA territory, volume less than 36 mL (considering the probably artifactual volume penumbra in the posterior fossa) Mismatch Volume: Not applicable Infarction Location:Not applicable CTA NECK Skeleton: Moderate and severe degenerative changes in the cervical spine including  3-4 millimeters of anterolisthesis of C3 on C4 and reversed cervical lordosis. Underlying thoracic scoliosis. Osteopenia. No acute osseous abnormality identified. Upper chest: Centrilobular emphysema with superimposed dependent pulmonary opacity in the left upper lobe and superior segment right lower lobe. The visible Major airways are patent. No superior mediastinal lymphadenopathy. Other neck: Large left  thyroid goiter with mild regional mass effect. Partially retropharyngeal course of both carotid arteries. Otherwise negative. No cervical lymphadenopathy. Aortic arch: Ectatic visible thoracic aorta with the aortic arch measuring about 36 millimeters diameter and the proximal descending thoracic aorta up to 40 millimeters diameter. Scattered calcified arch atherosclerosis. Four vessel arch configuration with an aberrancy origin of the right subclavian artery. Tortuous proximal great vessels. Right carotid system: Dedicated right CCA origin off of the arch with tortuosity through the superior mediastinum and thoracic inlet. No proximal CCA stenosis. Widely patent right carotid bifurcation with mild calcified plaque. Severely tortuous and ectatic but widely patent cervical right ICA to the skull base. Left carotid system: Calcified plaque at the left CCA origin not resulting in significant stenosis. Tortuous left CCA at the thoracic inlet and in the neck with a partially retropharyngeal course. Bulky calcified plaque at the left carotid bifurcation resulting in an estimated 50 % stenosis with respect to the distal vessel just proximal to the bifurcation. The left ICA origin and bulb then are widely patent. The cervical left ICA is severely tortuous and ectatic but patent to the skull base without additional stenosis. 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 The right vertebral artery origin is patent without stenosis. The right V1 segment is highly tortuous. The right V 2 segment is tortuous. The right vertebral is patent to the skull base without stenosis. No proximal left subclavian artery stenosis despite calcified plaque. Mild left subclavian tortuosity. Normal left vertebral artery origin. Highly tortuous left V1 and V2 segments. No left vertebral artery plaque or stenosis identified to the skull  base. CTA HEAD Posterior circulation: Patent distal vertebral arteries without stenosis, the left is dominant. Patent bilateral PICA origins. Patent vertebrobasilar junction. Patent basilar artery with mild irregularity and tortuosity. Patent SCA origins. Fetal type bilateral PCA origins, more so the right. The proximal PCAs are patent without significant stenosis. The bilateral PCAs are regular, especially both inferior P3 divisions, but demonstrate preserved distal enhancement. Anterior circulation: Both ICA siphons are patent. There is left siphon ectasia and calcified plaque with only mild cavernous segment stenosis. The left ophthalmic and posterior communicating artery origins are normal. The right siphon is ectatic with mild plaque and no significant stenosis. Normal right ophthalmic and posterior communicating artery origins. Patent carotid termini, MCA and ACA origins. Dominant left A1. Anterior communicating artery and bilateral ACA branches are within normal limits. Left MCA M1 segment and bifurcation are patent. The left MCA branches appear patent with only mild irregularity. The right MCA M1 segment is patent without stenosis. The right MCA bifurcation is patent, but there is asymmetric paucity of right MCA M2 and M3 branches suspicious for occlusion of a middle sylvian M2 branch, although no discretely occluded vessel is evident. The visible enhancing right MCA branches are irregular but patent without a proximal high-grade stenosis. Venous sinuses: Appear patent on these early arterial phase images. Anatomic variants: Aberrancy right subclavian artery origin. Dominant distal left vertebral artery. Dominant left ACA A1 segment. Review of the MIP images confirms the above findings IMPRESSION: 1. Constellation  of findings suspicious for a right MCA M2 occlusion at the trifurcation, although no discrete occluded vessel is identified on the CTA. Some artifactual findings on CT perfusion are suspected, but  CTP suggests a posterior right MCA territory penumbra with no core infarct detected. 2. The above was discussed by telephone with Dr. Caryl Pina on 10/02/2017 at 1024 hours. 3. Underlying generalized Arterial Ectasia, including ectatic aortic arch (see #5), bilateral carotid arteries, and aberrant right subclavian artery. Atherosclerosis, but no hemodynamically significant stenosis in the neck or at the skull base. 4. Aortic Atherosclerosis (ICD10-I70.0) and Emphysema (ICD10-J43.9). 5. Aneurysmal descending thoracic aorta, 40 millimeters diameter. Recommend annual imaging followup by CTA or MRA. This recommendation follows 2010 ACCF/AHA/AATS/ACR/ASA/SCA/SCAI/SIR/STS/SVM Guidelines for the Diagnosis and Management of Patients with Thoracic Aortic Disease. Circulation. 2010; 121: [id number]. Electronically Signed   By: Odessa Fleming M.D.   On: 10/02/2017 10:46   Ct Cerebral Perfusion W Contrast  Result Date: 10/02/2017 CLINICAL DATA:  84 year old female code stroke clinically with symptoms of right MCA thrombosis. EXAM: CT ANGIOGRAPHY HEAD AND NECK CT PERFUSION BRAIN TECHNIQUE: Multidetector CT imaging of the head and neck was performed using the standard protocol during bolus administration of intravenous contrast. Multiplanar CT image reconstructions and MIPs were obtained to evaluate the vascular anatomy. Carotid stenosis measurements (when applicable) are obtained utilizing NASCET criteria, using the distal internal carotid diameter as the denominator. Multiphase CT imaging of the brain was performed following IV bolus contrast injection. Subsequent parametric perfusion maps were calculated using RAPID software. CONTRAST:  95 milliliters Isovue 370 COMPARISON:  Presentation noncontrast head CT 0957 hours today. FINDINGS: CT Brain Perfusion Findings: The study is somewhat limited by motion artifact occuring late in the data acquisition. And subsequently abnormal T-max in the posterior fossa is probably artifact.  ASPECTS 10 at 0957 hours today. CBF (<30%) Volume: zero, no core infarct detected. Perfusion (Tmax>6.0s) volume: Evidence of penumbra in the posterior right MCA territory, volume less than 36 mL (considering the probably artifactual volume penumbra in the posterior fossa) Mismatch Volume: Not applicable Infarction Location:Not applicable CTA NECK Skeleton: Moderate and severe degenerative changes in the cervical spine including 3-4 millimeters of anterolisthesis of C3 on C4 and reversed cervical lordosis. Underlying thoracic scoliosis. Osteopenia. No acute osseous abnormality identified. Upper chest: Centrilobular emphysema with superimposed dependent pulmonary opacity in the left upper lobe and superior segment right lower lobe. The visible Major airways are patent. No superior mediastinal lymphadenopathy. Other neck: Large left thyroid goiter with mild regional mass effect. Partially retropharyngeal course of both carotid arteries. Otherwise negative. No cervical lymphadenopathy. Aortic arch: Ectatic visible thoracic aorta with the aortic arch measuring about 36 millimeters diameter and the proximal descending thoracic aorta up to 40 millimeters diameter. Scattered calcified arch atherosclerosis. Four vessel arch configuration with an aberrancy origin of the right subclavian artery. Tortuous proximal great vessels. Right carotid system: Dedicated right CCA origin off of the arch with tortuosity through the superior mediastinum and thoracic inlet. No proximal CCA stenosis. Widely patent right carotid bifurcation with mild calcified plaque. Severely tortuous and ectatic but widely patent cervical right ICA to the skull base. Left carotid system: Calcified plaque at the left CCA origin not resulting in significant stenosis. Tortuous left CCA at the thoracic inlet and in the neck with a partially retropharyngeal course. Bulky calcified plaque at the left carotid bifurcation resulting in an estimated 50 % stenosis with  respect to the distal vessel just proximal to the bifurcation. The left ICA origin and bulb  then are widely patent. The cervical left ICA is severely tortuous and ectatic but patent to the skull base without additional stenosis. 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 The right vertebral artery origin is patent without stenosis. The right V1 segment is highly tortuous. The right V 2 segment is tortuous. The right vertebral is patent to the skull base without stenosis. No proximal left subclavian artery stenosis despite calcified plaque. Mild left subclavian tortuosity. Normal left vertebral artery origin. Highly tortuous left V1 and V2 segments. No left vertebral artery plaque or stenosis identified to the skull base. CTA HEAD Posterior circulation: Patent distal vertebral arteries without stenosis, the left is dominant. Patent bilateral PICA origins. Patent vertebrobasilar junction. Patent basilar artery with mild irregularity and tortuosity. Patent SCA origins. Fetal type bilateral PCA origins, more so the right. The proximal PCAs are patent without significant stenosis. The bilateral PCAs are regular, especially both inferior P3 divisions, but demonstrate preserved distal enhancement. Anterior circulation: Both ICA siphons are patent. There is left siphon ectasia and calcified plaque with only mild cavernous segment stenosis. The left ophthalmic and posterior communicating artery origins are normal. The right siphon is ectatic with mild plaque and no significant stenosis. Normal right ophthalmic and posterior communicating artery origins. Patent carotid termini, MCA and ACA origins. Dominant left A1. Anterior communicating artery and bilateral ACA branches are within normal limits. Left MCA M1 segment and bifurcation are patent. The left MCA branches appear patent with only mild irregularity. The  right MCA M1 segment is patent without stenosis. The right MCA bifurcation is patent, but there is asymmetric paucity of right MCA M2 and M3 branches suspicious for occlusion of a middle sylvian M2 branch, although no discretely occluded vessel is evident. The visible enhancing right MCA branches are irregular but patent without a proximal high-grade stenosis. Venous sinuses: Appear patent on these early arterial phase images. Anatomic variants: Aberrancy right subclavian artery origin. Dominant distal left vertebral artery. Dominant left ACA A1 segment. Review of the MIP images confirms the above findings IMPRESSION: 1. Constellation of findings suspicious for a right MCA M2 occlusion at the trifurcation, although no discrete occluded vessel is identified on the CTA. Some artifactual findings on CT perfusion are suspected, but CTP suggests a posterior right MCA territory penumbra with no core infarct detected. 2. The above was discussed by telephone with Dr. Caryl PinaERIC LINDZEN on 10/02/2017 at 1024 hours. 3. Underlying generalized Arterial Ectasia, including ectatic aortic arch (see #5), bilateral carotid arteries, and aberrant right subclavian artery. Atherosclerosis, but no hemodynamically significant stenosis in the neck or at the skull base. 4. Aortic Atherosclerosis (ICD10-I70.0) and Emphysema (ICD10-J43.9). 5. Aneurysmal descending thoracic aorta, 40 millimeters diameter. Recommend annual imaging followup by CTA or MRA. This recommendation follows 2010 ACCF/AHA/AATS/ACR/ASA/SCA/SCAI/SIR/STS/SVM Guidelines for the Diagnosis and Management of Patients with Thoracic Aortic Disease. Circulation. 2010; 121: Z610-R604: [id number]. Electronically Signed   By: Odessa FlemingH  Hall M.D.   On: 10/02/2017 10:46   Ct Head Code Stroke Wo Contrast  Result Date: 10/02/2017 CLINICAL DATA:  Code stroke. 84 year old female with left side weakness, facial droop, rightward gaze. Last seen normal 2100 hours yesterday. EXAM: CT HEAD WITHOUT CONTRAST  TECHNIQUE: Contiguous axial images were obtained from the base of the skull through the vertex without intravenous contrast. COMPARISON:  None. FINDINGS: Brain: Cerebral volume is within normal limits for age. No ventriculomegaly. No midline shift, mass effect, or evidence of intracranial  mass lesion. No acute intracranial hemorrhage identified. Widespread and confluent bilateral cerebral white matter hypodensity. Deep white matter capsule involvement. No superimposed acute cortically based infarct identified. Heterogeneity in the caudal left thalamus and midbrain. Asymmetric heterogeneity in the right pons. There are multiple small bilateral cerebellar infarcts, more numerous on the left. These are likely chronic. Vascular: Calcified atherosclerosis at the skull base. No suspicious intracranial vascular hyperdensity. Skull: No acute osseous abnormality identified. Sinuses/Orbits: Fluid level in the right maxillary sinus. Scattered ethmoid opacification. Other paranasal sinuses, the tympanic cavities, and mastoids are well pneumatized. Other: Only slight rightward gaze deviation on these images. No acute orbit or scalp soft tissue findings identified. ASPECTS [facility] Stroke Program Early CT Score) - Ganglionic level infarction (caudate, lentiform nuclei, internal capsule, insula, M1-M3 cortex): 7 - Supraganglionic infarction (M4-M6 cortex): 3 Total score (0-10 with 10 being normal): 10 IMPRESSION: 1. No acute cortically based infarct or acute intracranial hemorrhage identified. ASPECTS 10. 2. Chronic small vessel disease with age indeterminate involvement of the right pons. 3. Left greater than right cerebellar infarcts, probably chronic. 4. These results were communicated to Dr. Otelia Limes at 10:05 amon 4/13/2019by text page via the [facility] messaging system. Electronically Signed   By: Odessa Fleming M.D.   On: 10/02/2017 10:06        Scheduled Meds: .  stroke: mapping our early stages of recovery book   Does not  apply Once  . atorvastatin  40 mg Oral q1800  . clopidogrel  75 mg Oral Daily  . digoxin  0.125 mg Oral Daily  . enoxaparin (LOVENOX) injection  40 mg Subcutaneous Q24H  . ferrous gluconate  324 mg Oral TID WC  . FLUoxetine  20 mg Oral Daily  . loratadine  10 mg Oral Daily  . methimazole  5 mg Oral Daily  . mirtazapine  15 mg Oral QHS  . oxyCODONE  10 mg Oral TID  . pantoprazole  40 mg Oral Daily  . potassium chloride SA  20 mEq Oral Daily   Continuous Infusions: . sodium chloride 50 mL/hr at 10/03/17 0653     LOS: 1 day    Time spent: 25 minutes    Alberteen Sam, MD [facility] 10/03/2017, 9:30 AM     Pager [phone] --- please page though AMION:  [email or web address] Password [email or web address] If 7PM-7AM, please contact night-coverage

## 2017-10-03 NOTE — Evaluation (Addendum)
Occupational Therapy Evaluation Patient Details Name: Madison Roberson MRN: [id number] DOB: 04/09/1930 Today's Date: 10/03/2017    History of Present Illness Pt is an 84 y.o. 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 Pt presented to the ED after being found down at SNF with L sided weakness, L facial droop, and L neglect. CT perfusion revealed likely embolic R MCA CVA with M2 occlusion.    Clinical Impression   PTA, pt reports ambulating with RW at times and requiring assistance for LB ADL tasks. Unsure of reliability of pt's report due to some confusion this session. She was able to sit at EOB with mod assist for bed mobility to complete LB ADL tasks with max assist and grooming tasks with min assist. Pt requiring min assist for self-feeding tasks as well as notified RN of OT recommendation to have staff assist pt with meals for safety. Pt noted to demonstrate decreased L UE strength, decreased attention to L side of body, and significant L visual field deficits. She would benefit from continued OT services while admitted to improve independence and safety with ADL and functional mobility. Recommend continued rehabilitation at SNF level post-acute D/C to maximize functional ability to participate in ADL.    Follow Up Recommendations  SNF;Supervision/Assistance - 24 hour    Equipment Recommendations  Other (comment)(defer to next venue of care)    Recommendations for Other Services PT consult     Precautions / Restrictions Precautions Precautions: Fall Restrictions Other Position/Activity Restrictions: No orders for WB status.  Pt with L femur IM nail in February 2019. She reports she has been ambulatory with RW.       Mobility Bed Mobility Overal bed mobility: Needs Assistance Bed Mobility: Rolling;Sidelying to Sit;Sit to Supine Rolling: Min assist Sidelying to sit: Mod assist   Sit to supine: Max assist   General bed mobility comments: Mod assist to press [facility]. Max assist for return to supine.   Transfers Overall transfer level: Needs assistance   Transfers: Lateral/Scoot Transfers          Lateral/Scoot Transfers: Min guard General transfer comment: Able to complete multiple lateral scoots to reposition [facility] with min guard assist.     Balance Overall balance assessment: Needs assistance Sitting-balance support: No upper extremity supported;Feet supported Sitting balance-Leahy Scale: Fair Sitting balance - Comments: Supervision at EOB                                   ADL either performed or assessed with clinical judgement   ADL Overall ADL's : Needs assistance/impaired Eating/Feeding: Minimal assistance;Sitting Eating/Feeding Details (indicate cue type and reason): Will need assistance to continue with tasks.  Grooming: Minimal assistance;Sitting   Upper Body Bathing: Minimal assistance;Sitting   Lower Body Bathing: Maximal assistance;Sitting/lateral leans   Upper Body Dressing : Minimal assistance;Sitting   Lower Body Dressing: Maximal assistance;Sit to/from Market researcherstand     Toilet Transfer Details (indicate cue type and reason): Able to complete lateral scoots at EOB this session. Did not complete full toilet transfer as need further clarification concerning weight bearing status.  Toileting- Clothing  Manipulation and Hygiene: Maximal assistance;Sitting/lateral lean         General ADL Comments: Pt limited by decreased activity tolerance for ADL as well as cognitive deficits. Limited attention to L side of body.      Vision Patient Visual  Report: No change from baseline Vision Assessment?: Yes Eye Alignment: Within Functional Limits Ocular Range of Motion: Within Functional Limits Alignment/Gaze Preference: Gaze right Tracking/Visual Pursuits: Decreased smoothness of horizontal tracking;Decreased smoothness of vertical tracking(slow with tracking movements) Convergence: Within functional limits Visual Fields: Left visual field deficit Diplopia Assessment: (none reported) Depth Perception: Undershoots Additional Comments: Pt with noticable L visual field loss with confrontation testing as well as during functional tasks.      Perception     Praxis      Pertinent Vitals/Pain Pain Assessment: No/denies pain     Hand Dominance     Extremity/Trunk Assessment Upper Extremity Assessment Upper Extremity Assessment: LUE deficits/detail;Generalized weakness LUE Deficits / Details: Decreased attention to L side of body and visual field. Undershooting when reaching to target. Limited AROM L shoulder to 0-75 degrees approximately.  LUE Coordination: decreased fine motor;decreased gross motor   Lower Extremity Assessment Lower Extremity Assessment: Defer to PT evaluation       Communication Communication Communication: No difficulties   Cognition Arousal/Alertness: Lethargic Behavior During Therapy: WFL for tasks assessed/performed Overall Cognitive Status: No family/caregiver present to determine baseline cognitive functioning Area of Impairment: Orientation;Attention;Following commands;Safety/judgement;Problem solving                 Orientation Level: Disoriented to;Time;Situation Current Attention Level: Focused   Following Commands: Follows one step commands consistently;Follows one step commands with increased time Safety/Judgement: Decreased awareness of safety;Decreased awareness of deficits   Problem Solving: Slow processing;Difficulty sequencing;Requires verbal cues General Comments: Pt follow simple  one-step commands with increased time. Unsure of the date but able to report she is in the Hospital. She reports that she is in the hospital for a tumor rather than stroke. Pt will need assistance for feeding due to cognitive deficits and notified RN.    General Comments       Exercises     Shoulder Instructions      Home Living Family/patient expects to be discharged to:: Assisted living                             Home Equipment: Walker - 2 wheels   Additional Comments: Pt likely with other equipment at home but difficult to obtain some aspects of history due to confusion.       Prior Functioning/Environment Level of Independence: Needs assistance  Gait / Transfers Assistance Needed: Uses RW or is pushed in w/c per pt report. ADL's / Homemaking Assistance Needed: Pt reports having assistance for ADL from staff.    Comments: Question reliability of pt report.        OT Problem List: Decreased strength;Decreased range of motion;Decreased activity tolerance;Impaired balance (sitting and/or standing);Decreased safety awareness;Decreased knowledge of use of DME or AE;Decreased knowledge of precautions;Pain;Impaired UE functional use      OT Treatment/Interventions: Self-care/ADL training;Therapeutic exercise;Energy conservation;DME and/or AE instruction;Therapeutic activities;Patient/family education;Balance training    OT Goals(Current goals can be found in the care plan section) Acute Rehab OT Goals Patient Stated Goal: be warm OT Goal Formulation: With patient Time For Goal Achievement: 10/17/17 Potential to Achieve Goals: Good ADL Goals Pt Will Perform Grooming: with set-up;sitting Pt Will Transfer to Toilet:  with min assist;stand pivot transfer;bedside commode Pt Will Perform Toileting - Clothing Manipulation and hygiene: with min assist;sit to/from stand Pt/caregiver will Perform Home Exercise Program: Left upper extremity;With written HEP provided;Increased  strength;Increased ROM;With Supervision Additional ADL Goal #1: Pt will complete self-feeding tasks with supervision and no more than 1 VC for attention to task.  OT Frequency: Min 2X/week   Barriers to D/C:            Co-evaluation              AM-PAC PT "6 Clicks" Daily Activity     Outcome Measure Help from another person eating meals?: A Little Help from another person taking care of personal grooming?: A Little Help from another person toileting, which includes using toliet, bedpan, or urinal?: A Lot Help from another person bathing (including washing, rinsing, drying)?: A Lot Help from another person to put on and taking off regular upper body clothing?: A Little Help from another person to put on and taking off regular lower body clothing?: A Lot 6 Click Score: 15   End of Session Nurse Communication: Mobility status  Activity Tolerance: Patient tolerated treatment well Patient left: in bed;with call bell/phone within reach;with bed alarm set  OT Visit Diagnosis: Other abnormalities of gait and mobility (R26.89);Muscle weakness (generalized) (M62.81);Hemiplegia and hemiparesis Hemiplegia - Right/Left: Left Hemiplegia - caused by: Cerebral infarction                Time: 0756-0820 OT Time Calculation (min): 24 min Charges:  OT General Charges $OT Visit: 1 Visit OT Evaluation $OT Eval Moderate Complexity: 1 Mod OT Treatments $Self Care/Home Management : 8-22 mins G-Codes:     Doristine Section, MS OTR/L  Pager: [phone]   Jaelyn Bourgoin A Janiece Scovill 10/03/2017, 8:42 AM

## 2017-10-04 DIAGNOSIS — G9341 Metabolic encephalopathy: Secondary | ICD-10-CM

## 2017-10-04 LAB — BLOOD GAS, ARTERIAL
ACID-BASE EXCESS: 1.1 mmol/L (ref 0.0–2.0)
Bicarbonate: 24.7 mmol/L (ref 20.0–28.0)
DRAWN BY: 518061
O2 Content: 2 L/min
O2 SAT: 95.7 %
PATIENT TEMPERATURE: 98.6
pCO2 arterial: 36.1 mmHg (ref 32.0–48.0)
pH, Arterial: 7.45 (ref 7.350–7.450)
pO2, Arterial: 81.1 mmHg — ABNORMAL LOW (ref 83.0–108.0)

## 2017-10-04 LAB — BASIC METABOLIC PANEL
Anion gap: 12 (ref 5–15)
BUN: 6 mg/dL (ref 6–20)
CALCIUM: 8 mg/dL — AB (ref 8.9–10.3)
CO2: 20 mmol/L — AB (ref 22–32)
CREATININE: 0.7 mg/dL (ref 0.44–1.00)
Chloride: 106 mmol/L (ref 101–111)
Glucose, Bld: 104 mg/dL — ABNORMAL HIGH (ref 65–99)
Potassium: 4.1 mmol/L (ref 3.5–5.1)
Sodium: 138 mmol/L (ref 135–145)

## 2017-10-04 LAB — CBC
HCT: 43.9 % (ref 36.0–46.0)
Hemoglobin: 14.4 g/dL (ref 12.0–15.0)
MCH: 29.1 pg (ref 26.0–34.0)
MCHC: 32.8 g/dL (ref 30.0–36.0)
MCV: 88.7 fL (ref 78.0–100.0)
PLATELETS: 284 10*3/uL (ref 150–400)
RBC: 4.95 MIL/uL (ref 3.87–5.11)
RDW: 21.5 % — ABNORMAL HIGH (ref 11.5–15.5)
WBC: 10.9 10*3/uL — ABNORMAL HIGH (ref 4.0–10.5)

## 2017-10-04 LAB — GLUCOSE, CAPILLARY: GLUCOSE-CAPILLARY: 112 mg/dL — AB (ref 65–99)

## 2017-10-04 MED ORDER — METOPROLOL TARTRATE 25 MG PO TABS
25.0000 mg | ORAL_TABLET | Freq: Two times a day (BID) | ORAL | Status: DC
  Administered 2017-10-04 – 2017-10-05 (×3): 25 mg via ORAL
  Filled 2017-10-04 (×3): qty 1

## 2017-10-04 MED ORDER — METOPROLOL TARTRATE 5 MG/5ML IV SOLN: 5.0000 mg | Freq: Once | INTRAVENOUS | Status: DC

## 2017-10-04 MED ORDER — ENSURE ENLIVE PO LIQD
237.0000 mL | Freq: Two times a day (BID) | ORAL | Status: DC
  Administered 2017-10-04 – 2017-10-05 (×2): 237 mL via ORAL

## 2017-10-04 NOTE — Progress Notes (Signed)
Called by CCMT that pt hr went up 160 bpm. RN  came in  Pt room and found pt trying to get oob.  Assisted back in bed and offered bedpan as pt wants to void  H R  came down to 120 bpm   Now the patient denied pain or SOB . RN at bed side . Will continues to monitor pt.

## 2017-10-04 NOTE — Progress Notes (Signed)
Initial Nutrition Assessment  DOCUMENTATION CODES:   Underweight  INTERVENTION:    Ensure Enlive po BID, each supplement provides 350 kcal and 20 grams of protein  NUTRITION DIAGNOSIS:   Increased nutrient needs related to chronic illness as evidenced by estimated needs  GOAL:   Patient will meet greater than or equal to 90% of their needs  MONITOR:   PO intake, Supplement acceptance, Skin, Labs, Weight trends, I & O's  REASON FOR ASSESSMENT:   Consult Assessment of nutrition requirement/status  ASSESSMENT:   84 y.o. Female with COPD on home O2, hyperthyroidism on methimazole, anemia, hx crebellar stroke, and Afib not on AC due to GIB in 2017 and frequent falls who presents with left sided weakness, left facial droop and neglect.  RD briefly spoke with pt at bedside. She was able to answer "poor" when asked how appetite was. Pt dozed off to sleep after RD asked second interview question. Pt is on a Heart Healthy diet. No % PO intake records available. Labs and medications reviewed.   NUTRITION - FOCUSED PHYSICAL EXAM:  Unable to complete at this time. Suspect malnutrition.  Diet Order:  Fall precautions Diet Heart Room service appropriate? Yes; Fluid consistency: Thin  EDUCATION NEEDS:   Not appropriate for education at this time  Skin:  Skin Assessment: Reviewed RN Assessment  Last BM:  PTA   Intake/Output Summary (Last 24 hours) at 10/04/2017 1453 Last data filed at 10/04/2017 0600 Gross per 24 hour  Intake 1120 ml  Output 200 ml  Net 920 ml   Height:   Ht Readings from Last 1 Encounters:  10/02/17 5\' 4"  (1.626 m)   Weight:   Wt Readings from Last 1 Encounters:  10/02/17 100 lb 15.5 oz (45.8 kg)   Ideal Body Weight:  54.5 kg  BMI:  Body mass index is 17.33 kg/m.  Estimated Nutritional Needs:   Kcal:  1150-1350  Protein:  60-75 gm  Fluid:  >/= 1.5 L  Maureen ChattersKatie Lajuan Kovaleski, RD, LDN Pager #: [phone] After-Hours Pager #: [phone]

## 2017-10-04 NOTE — NC FL2 (Signed)
[place] MEDICAID FL2 LEVEL OF CARE SCREENING TOOL     IDENTIFICATION  Patient Name: Madison Roberson Birthdate: 08/07/1929 Sex: female Admission Date (Current Location): 10/02/2017  [facility] [place]   Facility and Address:  [facility], [address], [place], [place] [address]      Provider Number: [id number]  Attending Physician Name and Address:  Alberteen Samanford, Christopher P, *  Relative Name and Phone Number:       Current Level of Care: Hospital Recommended Level of Care: Skilled Nursing Facility Prior Approval Number:    Date Approved/Denied:   PASRR Number:  [id number]   Discharge Plan: SNF    Current Diagnoses: Patient Active Problem List   Diagnosis Date Noted  . Acute CVA (cerebrovascular accident) (HCC) 10/02/2017  . History of CVA (cerebrovascular accident) 10/02/2017  . Atrial fibrillation (HCC) 10/02/2017  . COPD (chronic obstructive pulmonary disease) (HCC) 10/02/2017  . Chronic respiratory failure with hypoxia (HCC) 10/02/2017  . Hyperthyroidism 10/02/2017  . Frequent falls 10/02/2017  . Protein calorie malnutrition (HCC) 10/02/2017  . Fe deficiency anemia 10/02/2017  . Acute dehydration 10/02/2017  . OSA (obstructive sleep apnea) 10/02/2017  . Alteration in anticoagulation     Orientation RESPIRATION BLADDER Height & Weight     Self, Situation, Place  O2(Nasal Cannula; 2L) Incontinent Weight: 100 lb 15.5 oz (45.8 kg) Height:  5\' 4"  (162.6 cm)  BEHAVIORAL SYMPTOMS/MOOD NEUROLOGICAL BOWEL NUTRITION STATUS      Continent Diet(Heart healthy, thin liquids)  AMBULATORY STATUS COMMUNICATION OF NEEDS Skin   Limited Assist Verbally Normal                       Personal Care Assistance Level of Assistance  Bathing, Feeding, Dressing Bathing Assistance: Limited assistance Feeding assistance: Independent Dressing Assistance: Limited assistance     Functional Limitations Info  Sight, Hearing, Speech  Sight Info: Adequate Hearing Info: Adequate Speech Info: Adequate    SPECIAL CARE FACTORS FREQUENCY  PT (By licensed PT), OT (By licensed OT)     PT Frequency: 3x OT Frequency: 3x            Contractures Contractures Info: Not present    Additional Factors Info  Code Status, Allergies Code Status Info: Full Code Allergies Info: Lyrica Pregabalin, Metoclopromide Metoclopramide, Duloxetine, Sulfa Antibiotics           Current Medications (10/04/2017):  This is the current hospital active medication list Current Facility-Administered Medications  Medication Dose Route Frequency Provider Last Rate Last Dose  .  stroke: mapping our early stages of recovery book   Does not apply Once Russella DarEllis, Allison L, NP   Stopped at 10/02/17 1218  . acetaminophen (TYLENOL) tablet 650 mg  650 mg Oral Q4H PRN Russella DarEllis, Allison L, NP   650 mg at 10/02/17 2306   Or  . acetaminophen (TYLENOL) solution 650 mg  650 mg Per Tube Q4H PRN Russella DarEllis, Allison L, NP       Or  . acetaminophen (TYLENOL) suppository 650 mg  650 mg Rectal Q4H PRN Russella DarEllis, Allison L, NP      . atorvastatin (LIPITOR) tablet 40 mg  40 mg Oral q1800 Russella DarEllis, Allison L, NP   40 mg at 10/03/17 1708  . clopidogrel (PLAVIX) tablet 75 mg  75 mg Oral Daily Caryl PinaLindzen, Eric, MD   75 mg at 10/04/17 0930  . digoxin (LANOXIN) tablet 0.125 mg  0.125 mg Oral Daily Russella DarEllis, Allison L, NP  0.125 mg at 10/04/17 0929  . enoxaparin (LOVENOX) injection 40 mg  40 mg Subcutaneous Q24H Russella Dar, NP   40 mg at 10/04/17 1233  . feeding supplement (ENSURE ENLIVE) (ENSURE ENLIVE) liquid 237 mL  237 mL Oral BID BM Danford, Earl Lites, MD      . ferrous gluconate (FERGON) tablet 324 mg  324 mg Oral TID WC Russella Dar, NP   324 mg at 10/04/17 1234  . FLUoxetine (PROZAC) capsule 20 mg  20 mg Oral Daily Russella Dar, NP   20 mg at 10/04/17 0929  . hydrALAZINE (APRESOLINE) injection 10 mg  10 mg Intravenous Q6H PRN Russella Dar, NP      . iopamidol  (ISOVUE-370) 76 % injection 100 mL  100 mL Intravenous Once PRN Russella Dar, NP      . loratadine (CLARITIN) tablet 10 mg  10 mg Oral Daily Russella Dar, NP   10 mg at 10/04/17 0929  . methimazole (TAPAZOLE) tablet 5 mg  5 mg Oral Daily Russella Dar, NP   5 mg at 10/04/17 1234  . metoprolol tartrate (LOPRESSOR) tablet 25 mg  25 mg Oral BID Alberteen Sam, MD   25 mg at 10/04/17 1234  . mirtazapine (REMERON) tablet 15 mg  15 mg Oral QHS Russella Dar, NP   15 mg at 10/03/17 2109  . pantoprazole (PROTONIX) EC tablet 40 mg  40 mg Oral Daily Russella Dar, NP   40 mg at 10/04/17 0929  . potassium chloride SA (K-DUR,KLOR-CON) CR tablet 20 mEq  20 mEq Oral Daily Russella Dar, NP   20 mEq at 10/04/17 4098     Discharge Medications: Please see discharge summary for a list of discharge medications.  Relevant Imaging Results:  Relevant Lab Results:   Additional Information SSN: [id number]  Contact precautions: MRSA  Yariel Ferraris A Tameko Halder, LCSW

## 2017-10-04 NOTE — Discharge Summary (Addendum)
Physician Discharge Summary  Madison Roberson UEA:[id number] DOB: 11/25/1929 DOA: 10/02/2017  PCP: Olen Cordial, MD  Admit date: 10/02/2017 Discharge date: 10/05/2017  Admitted From: SNF  Disposition:  SNF   Recommendations for Outpatient Follow-up:  1. Follow up with PCP in 1-2 weeks 2. Please stop Plavix and start Eliquis 2.5 mg BID in 1 week 3. Please follow up with [facility] in 4 weeks 4. Repeat Lipids in 3 months 5. Repeat CT chest to evaluate aneurysm in 1 year 6. Follow up with Cardiology for new reduced ejection fraction, if needed   Home Health: N/A  Equipment/Devices: Walker  Discharge Condition: Fair  CODE STATUS: FULL Diet recommendation: Cardiac  Brief/Interim Summary: [name] is an 84 y.o. F with COPD on home O2, hyperthyroidism on methimazole, anemia, hx crebellar stroke, and Afib not on AC due to GIB in 2017 and frequent falls who presents with left sided weakness, left facial droop and neglect.        Discharge Diagnoses:   Likely acute CVA, presumed embolic, posterior right MCA territory History of stroke History of femoral arterial embolism s/p embolectomy x2 MRI showed "1. Extensive acute infarct in the right MCA and right PCA (which is fetal type) distribution as described. Patchy petechial hemorrhage." -Antiplatelet agent changed from aspirin to Plavix --> will change to Eliquis in 1 week -New statin for LDL goal <70 -Carotid imaging with CTA showed ectatic carotids but no stenosis >50% -Echocardiogram showed reduced EF 40-45%, no embolic source -VTE prophylaxis ordered at time of admission -Atrial fibrillation: started on Plavix --> will delay initiation of Eliquis for 1 week due to size of infarct -tPA not given because of no LVO -Dysphagia screen ordered in ER -Nonsmoker   Permanent atrial fibrillation CHA2DS2-Vasc 7 (hx CHF, age, gender, history stroke, vascular disease/femoral embolism).  Confers 11% risk of stroke per year,  15% risk of stroke or systemic embolism.  Digoxin was continued.  Diltiazem was discontinued to allow permissive hypertension.  See below re: replacement with BB give EF.  COPD Chronic respiratory failure on Home O2 2.5L No active disese.  Continued on home O2   Chronic systolic and diastolic CHF EF now 40%.  Appeared euvolemic.  Recommend diltiazem be replaced with metoprolol on restarting antihypertenives.  Consideration of adding ARB to obtain goal <130/80 mmHg.  Hyperthyroidism No change to methimazole.  Clinically inactive.  Dehydration Resolved with fluids.  Chronic blood loss anemia Has history of chronic blood loss anemia.  Never frank GI bleed.  Admitted to [facility] Feb 2017 for anemia, endsocopy was not done.  No clinical bleeding was noted, but anticoagulation was stopped.  Restarted this hospitalization, close monitoring of Hgb recommended.  OSA  Severe protein calorie malnutrition  Other medications     Discharge Instructions  Discharge Instructions    Diet - low sodium heart healthy   Complete by:  As directed    Discharge instructions   Complete by:  As directed    From Dr. Maryfrances Bunnell: You were admitted for stroke cause by atrial fibrillation.  You should stop taking aspirin. For 1 week, take clopidogrel/Plavix 75 mg Then, start Eliquis 2.5 mg twice daily  Also, start taking atorvastatin 40 mg daily.  Stop diltiazem and this has been replacd with metoprolol.   Increase activity slowly   Complete by:  As directed      Allergies as of 10/05/2017      Reactions   Lyrica [pregabalin]    Unknown   Metoclopromide [metoclopramide] Other (  See Comments)   Confusion   Duloxetine Other (See Comments)   Dizziness   Sulfa Antibiotics Nausea And Vomiting      Medication List    STOP taking these medications   aspirin 81 MG chewable tablet   diltiazem 180 MG 24 hr capsule Commonly known as:  CARDIZEM CD   rivaroxaban 10 MG Tabs tablet Commonly  known as:  XARELTO     TAKE these medications   acetaminophen 650 MG CR tablet Commonly known as:  TYLENOL Take 650 mg by mouth 3 (three) times daily as needed for pain.   atorvastatin 40 MG tablet Commonly known as:  LIPITOR Take 1 tablet (40 mg total) by mouth daily at 6 PM.   cetirizine 10 MG tablet Commonly known as:  ZYRTEC Take 10 mg by mouth daily.   clopidogrel 75 MG tablet Commonly known as:  PLAVIX Take 1 tablet (75 mg total) by mouth daily. Start taking on:  10/06/2017   digoxin 0.125 MG tablet Commonly known as:  LANOXIN Take 0.125 mg by mouth daily.   feeding supplement (ENSURE ENLIVE) Liqd Take 237 mLs by mouth 2 (two) times daily between meals.   ferrous gluconate 225 (27 Fe) MG tablet Commonly known as:  FERGON Take 240 mg by mouth 3 (three) times daily with meals.   FLUoxetine 20 MG capsule Commonly known as:  PROZAC Take 20 mg by mouth daily.   methimazole 5 MG tablet Commonly known as:  TAPAZOLE Take 5 mg by mouth daily.   metoprolol tartrate 25 MG tablet Commonly known as:  LOPRESSOR Take 1 tablet (25 mg total) by mouth 2 (two) times daily.   mirtazapine 15 MG tablet Commonly known as:  REMERON Take 15 mg by mouth at bedtime.   oxyCODONE 5 MG immediate release tablet Commonly known as:  Oxy IR/ROXICODONE Take 1 tablet (5 mg total) by mouth 3 (three) times daily as needed for moderate pain. What changed:    medication strength  how much to take  when to take this  reasons to take this   OXYGEN Inhale 2.5 L into the lungs continuous as needed.   potassium chloride SA 20 MEQ tablet Commonly known as:  K-DUR,KLOR-CON Take 20 mEq by mouth daily.      Contact information for after-discharge care    Destination    [facility] .   Service:  Skilled Nursing Contact information: [address] [place] [id number] [phone]             Allergies  Allergen Reactions  . Lyrica  [Pregabalin]     Unknown  . Metoclopromide [Metoclopramide] Other (See Comments)    Confusion  . Duloxetine Other (See Comments)    Dizziness  . Sulfa Antibiotics Nausea And Vomiting    Consultations:  Neurology   Procedures/Studies: Ct Angio Head W Or Wo Contrast  Result Date: 10/02/2017 CLINICAL DATA:  84 year old female code stroke clinically with symptoms of right MCA thrombosis. EXAM: CT ANGIOGRAPHY HEAD AND NECK CT PERFUSION BRAIN TECHNIQUE: Multidetector CT imaging of the head and neck was performed using the standard protocol during bolus administration of intravenous contrast. Multiplanar CT image reconstructions and MIPs were obtained to evaluate the vascular anatomy. Carotid stenosis measurements (when applicable) are obtained utilizing NASCET criteria, using the distal internal carotid diameter as the denominator. Multiphase CT imaging of the brain was performed following IV bolus contrast injection. Subsequent parametric perfusion maps were calculated using RAPID software. CONTRAST:  95  milliliters Isovue 370 COMPARISON:  Presentation noncontrast head CT 0957 hours today. FINDINGS: CT Brain Perfusion Findings: The study is somewhat limited by motion artifact occuring late in the data acquisition. And subsequently abnormal T-max in the posterior fossa is probably artifact. ASPECTS 10 at 0957 hours today. CBF (<30%) Volume: zero, no core infarct detected. Perfusion (Tmax>6.0s) volume: Evidence of penumbra in the posterior right MCA territory, volume less than 36 mL (considering the probably artifactual volume penumbra in the posterior fossa) Mismatch Volume: Not applicable Infarction Location:Not applicable CTA NECK Skeleton: Moderate and severe degenerative changes in the cervical spine including 3-4 millimeters of anterolisthesis of C3 on C4 and reversed cervical lordosis. Underlying thoracic scoliosis. Osteopenia. No acute osseous abnormality identified. Upper chest: Centrilobular  emphysema with superimposed dependent pulmonary opacity in the left upper lobe and superior segment right lower lobe. The visible Major airways are patent. No superior mediastinal lymphadenopathy. Other neck: Large left thyroid goiter with mild regional mass effect. Partially retropharyngeal course of both carotid arteries. Otherwise negative. No cervical lymphadenopathy. Aortic arch: Ectatic visible thoracic aorta with the aortic arch measuring about 36 millimeters diameter and the proximal descending thoracic aorta up to 40 millimeters diameter. Scattered calcified arch atherosclerosis. Four vessel arch configuration with an aberrancy origin of the right subclavian artery. Tortuous proximal great vessels. Right carotid system: Dedicated right CCA origin off of the arch with tortuosity through the superior mediastinum and thoracic inlet. No proximal CCA stenosis. Widely patent right carotid bifurcation with mild calcified plaque. Severely tortuous and ectatic but widely patent cervical right ICA to the skull base. Left carotid system: Calcified plaque at the left CCA origin not resulting in significant stenosis. Tortuous left CCA at the thoracic inlet and in the neck with a partially retropharyngeal course. Bulky calcified plaque at the left carotid bifurcation resulting in an estimated 50 % stenosis with respect to the distal vessel just proximal to the bifurcation. The left ICA origin and bulb then are widely patent. The cervical left ICA is severely tortuous and ectatic but patent to the skull base without additional stenosis. 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 The right vertebral artery origin is patent without stenosis. The right V1 segment is highly tortuous. The right V 2 segment is tortuous. The right vertebral is patent to the skull base without stenosis. No proximal left subclavian  artery stenosis despite calcified plaque. Mild left subclavian tortuosity. Normal left vertebral artery origin. Highly tortuous left V1 and V2 segments. No left vertebral artery plaque or stenosis identified to the skull base. CTA HEAD Posterior circulation: Patent distal vertebral arteries without stenosis, the left is dominant. Patent bilateral PICA origins. Patent vertebrobasilar junction. Patent basilar artery with mild irregularity and tortuosity. Patent SCA origins. Fetal type bilateral PCA origins, more so the right. The proximal PCAs are patent without significant stenosis. The bilateral PCAs are regular, especially both inferior P3 divisions, but demonstrate preserved distal enhancement. Anterior circulation: Both ICA siphons are patent. There is left siphon ectasia and calcified plaque with only mild cavernous segment stenosis. The left ophthalmic and posterior communicating artery origins are normal. The right siphon is ectatic with mild plaque and no significant stenosis. Normal right ophthalmic and posterior communicating artery origins. Patent carotid termini, MCA and ACA origins. Dominant left A1. Anterior communicating artery and bilateral ACA branches are within normal limits. Left MCA M1 segment and bifurcation are patent. The left MCA branches  appear patent with only mild irregularity. The right MCA M1 segment is patent without stenosis. The right MCA bifurcation is patent, but there is asymmetric paucity of right MCA M2 and M3 branches suspicious for occlusion of a middle sylvian M2 branch, although no discretely occluded vessel is evident. The visible enhancing right MCA branches are irregular but patent without a proximal high-grade stenosis. Venous sinuses: Appear patent on these early arterial phase images. Anatomic variants: Aberrancy right subclavian artery origin. Dominant distal left vertebral artery. Dominant left ACA A1 segment. Review of the MIP images confirms the above findings  IMPRESSION: 1. Constellation of findings suspicious for a right MCA M2 occlusion at the trifurcation, although no discrete occluded vessel is identified on the CTA. Some artifactual findings on CT perfusion are suspected, but CTP suggests a posterior right MCA territory penumbra with no core infarct detected. 2. The above was discussed by telephone with Dr. Caryl Pina on 10/02/2017 at 1024 hours. 3. Underlying generalized Arterial Ectasia, including ectatic aortic arch (see #5), bilateral carotid arteries, and aberrant right subclavian artery. Atherosclerosis, but no hemodynamically significant stenosis in the neck or at the skull base. 4. Aortic Atherosclerosis (ICD10-I70.0) and Emphysema (ICD10-J43.9). 5. Aneurysmal descending thoracic aorta, 40 millimeters diameter. Recommend annual imaging followup by CTA or MRA. This recommendation follows 2010 ACCF/AHA/AATS/ACR/ASA/SCA/SCAI/SIR/STS/SVM Guidelines for the Diagnosis and Management of Patients with Thoracic Aortic Disease. Circulation. 2010; 121: Z610-R604. Electronically Signed   By: Odessa Fleming M.D.   On: 10/02/2017 10:46   Ct Angio Neck W Or Wo Contrast  Result Date: 10/02/2017 CLINICAL DATA:  84 year old female code stroke clinically with symptoms of right MCA thrombosis. EXAM: CT ANGIOGRAPHY HEAD AND NECK CT PERFUSION BRAIN TECHNIQUE: Multidetector CT imaging of the head and neck was performed using the standard protocol during bolus administration of intravenous contrast. Multiplanar CT image reconstructions and MIPs were obtained to evaluate the vascular anatomy. Carotid stenosis measurements (when applicable) are obtained utilizing NASCET criteria, using the distal internal carotid diameter as the denominator. Multiphase CT imaging of the brain was performed following IV bolus contrast injection. Subsequent parametric perfusion maps were calculated using RAPID software. CONTRAST:  95 milliliters Isovue 370 COMPARISON:  Presentation noncontrast head CT  0957 hours today. FINDINGS: CT Brain Perfusion Findings: The study is somewhat limited by motion artifact occuring late in the data acquisition. And subsequently abnormal T-max in the posterior fossa is probably artifact. ASPECTS 10 at 0957 hours today. CBF (<30%) Volume: zero, no core infarct detected. Perfusion (Tmax>6.0s) volume: Evidence of penumbra in the posterior right MCA territory, volume less than 36 mL (considering the probably artifactual volume penumbra in the posterior fossa) Mismatch Volume: Not applicable Infarction Location:Not applicable CTA NECK Skeleton: Moderate and severe degenerative changes in the cervical spine including 3-4 millimeters of anterolisthesis of C3 on C4 and reversed cervical lordosis. Underlying thoracic scoliosis. Osteopenia. No acute osseous abnormality identified. Upper chest: Centrilobular emphysema with superimposed dependent pulmonary opacity in the left upper lobe and superior segment right lower lobe. The visible Major airways are patent. No superior mediastinal lymphadenopathy. Other neck: Large left thyroid goiter with mild regional mass effect. Partially retropharyngeal course of both carotid arteries. Otherwise negative. No cervical lymphadenopathy. Aortic arch: Ectatic visible thoracic aorta with the aortic arch measuring about 36 millimeters diameter and the proximal descending thoracic aorta up to 40 millimeters diameter. Scattered calcified arch atherosclerosis. Four vessel arch configuration with an aberrancy origin of the right subclavian artery. Tortuous proximal great vessels. Right carotid system: Dedicated right CCA  origin off of the arch with tortuosity through the superior mediastinum and thoracic inlet. No proximal CCA stenosis. Widely patent right carotid bifurcation with mild calcified plaque. Severely tortuous and ectatic but widely patent cervical right ICA to the skull base. Left carotid system: Calcified plaque at the left CCA origin not resulting  in significant stenosis. Tortuous left CCA at the thoracic inlet and in the neck with a partially retropharyngeal course. Bulky calcified plaque at the left carotid bifurcation resulting in an estimated 50 % stenosis with respect to the distal vessel just proximal to the bifurcation. The left ICA origin and bulb then are widely patent. The cervical left ICA is severely tortuous and ectatic but patent to the skull base without additional stenosis. 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 The right vertebral artery origin is patent without stenosis. The right V1 segment is highly tortuous. The right V 2 segment is tortuous. The right vertebral is patent to the skull base without stenosis. No proximal left subclavian artery stenosis despite calcified plaque. Mild left subclavian tortuosity. Normal left vertebral artery origin. Highly tortuous left V1 and V2 segments. No left vertebral artery plaque or stenosis identified to the skull base. CTA HEAD Posterior circulation: Patent distal vertebral arteries without stenosis, the left is dominant. Patent bilateral PICA origins. Patent vertebrobasilar junction. Patent basilar artery with mild irregularity and tortuosity. Patent SCA origins. Fetal type bilateral PCA origins, more so the right. The proximal PCAs are patent without significant stenosis. The bilateral PCAs are regular, especially both inferior P3 divisions, but demonstrate preserved distal enhancement. Anterior circulation: Both ICA siphons are patent. There is left siphon ectasia and calcified plaque with only mild cavernous segment stenosis. The left ophthalmic and posterior communicating artery origins are normal. The right siphon is ectatic with mild plaque and no significant stenosis. Normal right ophthalmic and posterior communicating artery origins. Patent carotid termini, MCA and ACA origins.  Dominant left A1. Anterior communicating artery and bilateral ACA branches are within normal limits. Left MCA M1 segment and bifurcation are patent. The left MCA branches appear patent with only mild irregularity. The right MCA M1 segment is patent without stenosis. The right MCA bifurcation is patent, but there is asymmetric paucity of right MCA M2 and M3 branches suspicious for occlusion of a middle sylvian M2 branch, although no discretely occluded vessel is evident. The visible enhancing right MCA branches are irregular but patent without a proximal high-grade stenosis. Venous sinuses: Appear patent on these early arterial phase images. Anatomic variants: Aberrancy right subclavian artery origin. Dominant distal left vertebral artery. Dominant left ACA A1 segment. Review of the MIP images confirms the above findings IMPRESSION: 1. Constellation of findings suspicious for a right MCA M2 occlusion at the trifurcation, although no discrete occluded vessel is identified on the CTA. Some artifactual findings on CT perfusion are suspected, but CTP suggests a posterior right MCA territory penumbra with no core infarct detected. 2. The above was discussed by telephone with Dr. Caryl Pina on 10/02/2017 at 1024 hours. 3. Underlying generalized Arterial Ectasia, including ectatic aortic arch (see #5), bilateral carotid arteries, and aberrant right subclavian artery. Atherosclerosis, but no hemodynamically significant stenosis in the neck or at the skull base. 4. Aortic Atherosclerosis (ICD10-I70.0) and Emphysema (ICD10-J43.9). 5. Aneurysmal descending thoracic aorta, 40 millimeters diameter. Recommend annual imaging followup by CTA or MRA. This recommendation follows 2010 ACCF/AHA/AATS/ACR/ASA/SCA/SCAI/SIR/STS/SVM Guidelines for the Diagnosis and Management  of Patients with Thoracic Aortic Disease. Circulation. 2010; 121: [id number]. Electronically Signed   By: Odessa Fleming M.D.   On: 10/02/2017 10:46   Mr Brain Wo  Contrast  Result Date: 10/03/2017 CLINICAL DATA:  Stroke follow-up EXAM: MRI HEAD WITHOUT CONTRAST TECHNIQUE: Multiplanar, multiecho pulse sequences of the brain and surrounding structures were obtained without intravenous contrast. COMPARISON:  Head CT, CTA, and cerebral perfusion FINDINGS: Brain: Confluent restricted diffusion in the right occipital cortex, medial right temporal lobe at the posterior hippocampus, right thalamus, splenium of the corpus callosum, and patchy along the right frontal parietal and superficial temporal cortex. Confluent FLAIR hyperintensity in the cerebral white matter attributed to severe chronic small vessel ischemia given demographics and history. Remote small vessel infarcts in the bilateral cerebellum. T2 hyperintensity in the deep gray nuclei and pons without noted expansion, likely also chronic and microvascular. Mild generalized atrophy. Hemorrhagic appearance along the infarcts considered petechial. No mass or hydrocephalus.  Mild for age atrophy. Vascular: Major flow voids are preserved. Skull and upper cervical spine: No evidence of marrow lesion. Sinuses/Orbits: Bilateral cataract resection. Patchy mucosal thickening in the ethmoids and fluid in the right maxillary sinus. IMPRESSION: 1. Extensive acute infarct in the right MCA and right PCA (which is fetal type) distribution as described. Patchy petechial hemorrhage. 2. Extensive white matter disease that is likely chronic small vessel ischemia. 3. Multiple small remote cerebellar infarcts. Electronically Signed   By: Marnee Spring M.D.   On: 10/03/2017 19:02   Ct Cerebral Perfusion W Contrast  Result Date: 10/02/2017 CLINICAL DATA:  84 year old female code stroke clinically with symptoms of right MCA thrombosis. EXAM: CT ANGIOGRAPHY HEAD AND NECK CT PERFUSION BRAIN TECHNIQUE: Multidetector CT imaging of the head and neck was performed using the standard protocol during bolus administration of intravenous contrast.  Multiplanar CT image reconstructions and MIPs were obtained to evaluate the vascular anatomy. Carotid stenosis measurements (when applicable) are obtained utilizing NASCET criteria, using the distal internal carotid diameter as the denominator. Multiphase CT imaging of the brain was performed following IV bolus contrast injection. Subsequent parametric perfusion maps were calculated using RAPID software. CONTRAST:  95 milliliters Isovue 370 COMPARISON:  Presentation noncontrast head CT 0957 hours today. FINDINGS: CT Brain Perfusion Findings: The study is somewhat limited by motion artifact occuring late in the data acquisition. And subsequently abnormal T-max in the posterior fossa is probably artifact. ASPECTS 10 at 0957 hours today. CBF (<30%) Volume: zero, no core infarct detected. Perfusion (Tmax>6.0s) volume: Evidence of penumbra in the posterior right MCA territory, volume less than 36 mL (considering the probably artifactual volume penumbra in the posterior fossa) Mismatch Volume: Not applicable Infarction Location:Not applicable CTA NECK Skeleton: Moderate and severe degenerative changes in the cervical spine including 3-4 millimeters of anterolisthesis of C3 on C4 and reversed cervical lordosis. Underlying thoracic scoliosis. Osteopenia. No acute osseous abnormality identified. Upper chest: Centrilobular emphysema with superimposed dependent pulmonary opacity in the left upper lobe and superior segment right lower lobe. The visible Major airways are patent. No superior mediastinal lymphadenopathy. Other neck: Large left thyroid goiter with mild regional mass effect. Partially retropharyngeal course of both carotid arteries. Otherwise negative. No cervical lymphadenopathy. Aortic arch: Ectatic visible thoracic aorta with the aortic arch measuring about 36 millimeters diameter and the proximal descending thoracic aorta up to 40 millimeters diameter. Scattered calcified arch atherosclerosis. Four vessel arch  configuration with an aberrancy origin of the right subclavian artery. Tortuous proximal great vessels. Right carotid system: Dedicated right CCA origin  off of the arch with tortuosity through the superior mediastinum and thoracic inlet. No proximal CCA stenosis. Widely patent right carotid bifurcation with mild calcified plaque. Severely tortuous and ectatic but widely patent cervical right ICA to the skull base. Left carotid system: Calcified plaque at the left CCA origin not resulting in significant stenosis. Tortuous left CCA at the thoracic inlet and in the neck with a partially retropharyngeal course. Bulky calcified plaque at the left carotid bifurcation resulting in an estimated 50 % stenosis with respect to the distal vessel just proximal to the bifurcation. The left ICA origin and bulb then are widely patent. The cervical left ICA is severely tortuous and ectatic but patent to the skull base without additional stenosis. 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 The right vertebral artery origin is patent without stenosis. The right V1 segment is highly tortuous. The right V 2 segment is tortuous. The right vertebral is patent to the skull base without stenosis. No proximal left subclavian artery stenosis despite calcified plaque. Mild left subclavian tortuosity. Normal left vertebral artery origin. Highly tortuous left V1 and V2 segments. No left vertebral artery plaque or stenosis identified to the skull base. CTA HEAD Posterior circulation: Patent distal vertebral arteries without stenosis, the left is dominant. Patent bilateral PICA origins. Patent vertebrobasilar junction. Patent basilar artery with mild irregularity and tortuosity. Patent SCA origins. Fetal type bilateral PCA origins, more so the right. The proximal PCAs are patent without significant stenosis. The bilateral PCAs are  regular, especially both inferior P3 divisions, but demonstrate preserved distal enhancement. Anterior circulation: Both ICA siphons are patent. There is left siphon ectasia and calcified plaque with only mild cavernous segment stenosis. The left ophthalmic and posterior communicating artery origins are normal. The right siphon is ectatic with mild plaque and no significant stenosis. Normal right ophthalmic and posterior communicating artery origins. Patent carotid termini, MCA and ACA origins. Dominant left A1. Anterior communicating artery and bilateral ACA branches are within normal limits. Left MCA M1 segment and bifurcation are patent. The left MCA branches appear patent with only mild irregularity. The right MCA M1 segment is patent without stenosis. The right MCA bifurcation is patent, but there is asymmetric paucity of right MCA M2 and M3 branches suspicious for occlusion of a middle sylvian M2 branch, although no discretely occluded vessel is evident. The visible enhancing right MCA branches are irregular but patent without a proximal high-grade stenosis. Venous sinuses: Appear patent on these early arterial phase images. Anatomic variants: Aberrancy right subclavian artery origin. Dominant distal left vertebral artery. Dominant left ACA A1 segment. Review of the MIP images confirms the above findings IMPRESSION: 1. Constellation of findings suspicious for a right MCA M2 occlusion at the trifurcation, although no discrete occluded vessel is identified on the CTA. Some artifactual findings on CT perfusion are suspected, but CTP suggests a posterior right MCA territory penumbra with no core infarct detected. 2. The above was discussed by telephone with Dr. Caryl Pina on 10/02/2017 at 1024 hours. 3. Underlying generalized Arterial Ectasia, including ectatic aortic arch (see #5), bilateral carotid arteries, and aberrant right subclavian artery. Atherosclerosis, but no hemodynamically significant stenosis in  the neck or at the skull base. 4. Aortic Atherosclerosis (ICD10-I70.0) and Emphysema (ICD10-J43.9). 5. Aneurysmal descending thoracic aorta, 40 millimeters diameter. Recommend annual imaging followup by CTA or MRA. This recommendation follows 2010 ACCF/AHA/AATS/ACR/ASA/SCA/SCAI/SIR/STS/SVM Guidelines for the Diagnosis and Management of  Patients with Thoracic Aortic Disease. Circulation. 2010; 121: Z610-R604. Electronically Signed   By: Odessa Fleming M.D.   On: 10/02/2017 10:46   Ct Head Code Stroke Wo Contrast  Result Date: 10/02/2017 CLINICAL DATA:  Code stroke. 84 year old female with left side weakness, facial droop, rightward gaze. Last seen normal 2100 hours yesterday. EXAM: CT HEAD WITHOUT CONTRAST TECHNIQUE: Contiguous axial images were obtained from the base of the skull through the vertex without intravenous contrast. COMPARISON:  None. FINDINGS: Brain: Cerebral volume is within normal limits for age. No ventriculomegaly. No midline shift, mass effect, or evidence of intracranial mass lesion. No acute intracranial hemorrhage identified. Widespread and confluent bilateral cerebral white matter hypodensity. Deep white matter capsule involvement. No superimposed acute cortically based infarct identified. Heterogeneity in the caudal left thalamus and midbrain. Asymmetric heterogeneity in the right pons. There are multiple small bilateral cerebellar infarcts, more numerous on the left. These are likely chronic. Vascular: Calcified atherosclerosis at the skull base. No suspicious intracranial vascular hyperdensity. Skull: No acute osseous abnormality identified. Sinuses/Orbits: Fluid level in the right maxillary sinus. Scattered ethmoid opacification. Other paranasal sinuses, the tympanic cavities, and mastoids are well pneumatized. Other: Only slight rightward gaze deviation on these images. No acute orbit or scalp soft tissue findings identified. ASPECTS [facility] Stroke Program Early CT Score) - Ganglionic level  infarction (caudate, lentiform nuclei, internal capsule, insula, M1-M3 cortex): 7 - Supraganglionic infarction (M4-M6 cortex): 3 Total score (0-10 with 10 being normal): 10 IMPRESSION: 1. No acute cortically based infarct or acute intracranial hemorrhage identified. ASPECTS 10. 2. Chronic small vessel disease with age indeterminate involvement of the right pons. 3. Left greater than right cerebellar infarcts, probably chronic. 4. These results were communicated to Dr. Otelia Limes at 10:05 amon 4/13/2019by text page via the [facility] messaging system. Electronically Signed   By: Odessa Fleming M.D.   On: 10/02/2017 10:06  Echocardiogram Study Conclusions  - Left ventricle: The cavity size was normal. There was severe   focal basal hypertrophy of the septum. Systolic function was   mildly to moderately reduced. The estimated ejection fraction was   in the range of 40% to 45%. Hypokinesis of the anteroseptal and   inferoseptal myocardium. - Aortic valve: There was moderate regurgitation. Valve area   (Vmax): 1.61 cm^2. - Aorta: Ascending aortic diameter: 3.6 mm (S). - Ascending aorta: The ascending aorta was at the upper limit of   normal. - Mitral valve: Calcified annulus. Transvalvular velocity was   within the normal range. There was no evidence for stenosis.   There was mild regurgitation. Valve area by pressure half-time: 2   cm^2. - Left atrium: The atrium was severely dilated. - Right ventricle: The cavity size was normal. Wall thickness was   normal. Systolic function was normal. - Right atrium: The atrium was severely dilated. - Atrial septum: No defect or patent foramen ovale was identified. - Tricuspid valve: There was moderate regurgitation. - Pulmonary arteries: Systolic pressure was mildly increased. PA   peak pressure: 44 mm Hg (S).    Subjective: No complaints.  Still weak.  Was very sleepy all day yesterday, oxycodone was held.  More alert otday.  No signs of infection, no fever, no  cough, no dysuria.  Still with left weakness, hemineglect.  Discharge Exam: Vitals:   10/05/17 0448 10/05/17 0721  BP: 122/82 132/82  Pulse: 74 69  Resp: 18 18  Temp: 97.7 F (36.5 C) 97.8 F (36.6 C)  SpO2: 100% 100%   Vitals:  10/04/17 1952 10/05/17 0009 10/05/17 0448 10/05/17 0721  BP: (!) 143/107 120/72 122/82 132/82  Pulse: 93 82 74 69  Resp: 19 18 18 18   Temp: 98.3 F (36.8 C) 98.2 F (36.8 C) 97.7 F (36.5 C) 97.8 F (36.6 C)  TempSrc: Oral Oral Oral Axillary  SpO2: 99% 99% 100% 100%  Weight:      Height:        General: Pt is alert, awake, not in acute distress, sitting in chair, with lunch Cardiovascular: RRR, S1/S2 +, no rubs, no gallops Respiratory: CTA bilaterally, no wheezing, no rhonchi Abdominal: Soft, NT, ND, bowel sounds + Extremities: no edema, no cyanosis Neuro: Psychomotor slowing, left sided neglect, left sided hemiparesis   The results of significant diagnostics from this hospitalization (including imaging, microbiology, ancillary and laboratory) are listed below for reference.     Microbiology: Recent Results (from the past 240 hour(s))  Culture, Urine     Status: Abnormal   Collection Time: 10/02/17 12:25 PM  Result Value Ref Range Status   Specimen Description URINE, RANDOM  Final   Special Requests   Final    NONE Performed at [facility], [address], [place], [place] [address]    Culture MULTIPLE SPECIES PRESENT, SUGGEST RECOLLECTION (A)  Final   Report Status 10/03/2017 FINAL  Final  MRSA PCR Screening     Status: Abnormal   Collection Time: 10/02/17 11:15 PM  Result Value Ref Range Status   MRSA by PCR POSITIVE (A) NEGATIVE Final    Comment:        The GeneXpert MRSA Assay (FDA approved for NASAL specimens only), is one component of a comprehensive MRSA colonization surveillance program. It is not intended to diagnose MRSA infection nor to guide or monitor treatment for MRSA infections. RESULT CALLED TO, READ  BACK BY AND VERIFIED WITH: WORTH,S RN [id number] 10/03/17 MITCHELL,L      Labs: BNP (last 3 results) No results for input(s): BNP in the last 8760 hours. Basic Metabolic Panel: Recent Labs  Lab 10/02/17 0951 10/02/17 0958 10/03/17 0727 10/04/17 1641  NA 141 139 137 138  K 4.0 3.9 3.7 4.1  CL 106 105 106 106  CO2  --  18* 20* 20*  GLUCOSE 148* 148* 113* 104*  BUN 10 9 6 6   CREATININE 0.70 0.82 0.73 0.70  CALCIUM  --  8.9 8.2* 8.0*   Liver Function Tests: Recent Labs  Lab 10/02/17 0958  AST 22  ALT 9*  ALKPHOS 163*  BILITOT 0.7  PROT 7.1  ALBUMIN 3.7   No results for input(s): LIPASE, AMYLASE in the last 168 hours. No results for input(s): AMMONIA in the last 168 hours. CBC: Recent Labs  Lab 10/02/17 0951 10/02/17 0958 10/03/17 0727 10/04/17 1641  WBC  --  10.8* 9.6 10.9*  NEUTROABS  --  8.0*  --   --   HGB 17.0* 15.2* 13.6 14.4  HCT 50.0* 48.8* 43.9 43.9  MCV  --  89.5 89.6 88.7  PLT  --  299 276 284   Cardiac Enzymes: No results for input(s): CKTOTAL, CKMB, CKMBINDEX, TROPONINI in the last 168 hours. BNP: Invalid input(s): POCBNP CBG: Recent Labs  Lab 10/02/17 0946 10/04/17 1527  GLUCAP 130* 112*   D-Dimer No results for input(s): DDIMER in the last 72 hours. Hgb A1c Recent Labs    10/03/17 0727  HGBA1C 4.8   Lipid Profile Recent Labs    10/03/17 0727  CHOL 209*  HDL 60  LDLCALC 120*  TRIG 145  CHOLHDL 3.5   Thyroid function studies Recent Labs    10/03/17 0727  TSH 1.839   Anemia work up No results for input(s): VITAMINB12, FOLATE, FERRITIN, TIBC, IRON, RETICCTPCT in the last 72 hours. Urinalysis    Component Value Date/Time   COLORURINE STRAW (A) 10/02/2017 1225   APPEARANCEUR CLEAR 10/02/2017 1225   LABSPEC 1.039 (H) 10/02/2017 1225   PHURINE 7.0 10/02/2017 1225   GLUCOSEU NEGATIVE 10/02/2017 1225   HGBUR NEGATIVE 10/02/2017 1225   BILIRUBINUR NEGATIVE 10/02/2017 1225   BILIRUBINUR negative 01/25/2017 1620   KETONESUR  NEGATIVE 10/02/2017 1225   PROTEINUR NEGATIVE 10/02/2017 1225   UROBILINOGEN 0.2 01/25/2017 1620   NITRITE NEGATIVE 10/02/2017 1225   LEUKOCYTESUR NEGATIVE 10/02/2017 1225   Sepsis Labs Invalid input(s): PROCALCITONIN,  WBC,  LACTICIDVEN Microbiology Recent Results (from the past 240 hour(s))  Culture, Urine     Status: Abnormal   Collection Time: 10/02/17 12:25 PM  Result Value Ref Range Status   Specimen Description URINE, RANDOM  Final   Special Requests   Final    NONE Performed at [facility], [address], [place], [place] [address]    Culture MULTIPLE SPECIES PRESENT, SUGGEST RECOLLECTION (A)  Final   Report Status 10/03/2017 FINAL  Final  MRSA PCR Screening     Status: Abnormal   Collection Time: 10/02/17 11:15 PM  Result Value Ref Range Status   MRSA by PCR POSITIVE (A) NEGATIVE Final    Comment:        The GeneXpert MRSA Assay (FDA approved for NASAL specimens only), is one component of a comprehensive MRSA colonization surveillance program. It is not intended to diagnose MRSA infection nor to guide or monitor treatment for MRSA infections. RESULT CALLED TO, READ BACK BY AND VERIFIED WITH: WORTH,S RN [id number] 10/03/17 MITCHELL,L      Time coordinating discharge: 40 minutes  SIGNED:   Alberteen Sam, MD  [facility] 10/05/2017, 12:23 PM

## 2017-10-04 NOTE — Clinical Social Work Note (Signed)
Clinical Social Work Assessment  Patient Details  Name: Madison Roberson MRN: [id number] Date of Birth: 07/12/1929  Date of referral:  10/04/17               Reason for consult:  Facility Placement                Permission sought to share information with:  Family Supports Permission granted to share information::     Name::     Cecille Rubin  Agency::     Relationship::  daughter  Contact Information:  [phone]  Housing/Transportation Living arrangements for the past 2 months:  [place] of Information:  Adult Children, Patient Patient Interpreter Needed:  None Criminal Activity/Legal Involvement Pertinent to Current Situation/Hospitalization:  No - Comment as needed Significant Relationships:  Adult Children Lives with:  Self Do you feel safe going back to the place where you live?  No Need for family participation in patient care:  No (Coment)  Care giving concerns:  Pt is alert and oriented x3. CSW met with pt and pt's daughter at bedside.  Social Worker assessment / plan:  Pt is from [facility]. Recommendation is for pt to go to SNF. Pt's daughter understanding. Pt was very sleepy in and out of conversation. Pt's daughter request that CSW reach out to [facility] in [place], followed by [place], and [place]. CSW will follow up.   Employment status:  Retired Forensic scientist:  Medicare PT Recommendations:  [place] / Referral to community resources:  [place]  Patient/Family's Response to care:  Pt's daughter verbalized understanding of CSW role and expressed appreciation for support. Pt's daughter denies any concern regarding pt care at this time.   Patient/Family's Understanding of and Emotional Response to Diagnosis, Current Treatment, and Prognosis:  Pt's daughter understanding and realistic regarding pt's physical limitations. Pt's daughter understands the need for pt to go to SNF at d/c.  Pt's  daughter denies any concern regarding pt's treatment plan at this time. CSW will continue to provide support and facilitate d/c needs.   Emotional Assessment Appearance:  Appears stated age Attitude/Demeanor/Rapport:  (Patient was appropriate) Affect (typically observed):  Accepting, Appropriate, Calm Orientation:  Oriented to Situation, Oriented to Place, Oriented to Self Alcohol / Substance use:  Not Applicable Psych involvement (Current and /or in the community):  No (Comment)  Discharge Needs  Concerns to be addressed:  Basic Needs, Care Coordination Readmission within the last 30 days:  No Current discharge risk:  Dependent with Mobility Barriers to Discharge:  Continued Medical Work up   [employer], LCSW 10/04/2017, 4:07 PM

## 2017-10-04 NOTE — Progress Notes (Signed)
PROGRESS NOTE    Madison Roberson  HYQ:[id number] DOB: [birth year] DOA: 10/02/2017 PCP: Olen Cordial, MD      Brief Narrative:  [name] is an 84 y.o. F with COPD on home O2, hyperthyroidism on methimazole, anemia, hx crebellar stroke, and Afib not on AC due to GIB in 2017 and frequent falls who presents with left sided weakness, left facial droop and neglect.   Assessment & Plan:  Acute CVA, presumed embolic, posterior right MCA territory History of stroke History of femoral arterial embolism status post embolectomy MRI shows "Extensive acute infarct in the right MCA and right PCA (which is fetal type) distribution as described. Patchy petechial hemorrhage." -Continue Plavix for now given extensive territory, transition to Eliquis in 2 weeks -Continue statin, increase dose -Carotid imaging with CTA showed ectatic carotids but no stenosis >50% -Echocardiogram showed no embolic source, EF 40% -VTE prophylaxis ordered at time of admission -tPA not given because of no LVO -Dysphagia screen ordered in ER -Nonsmoker   Lethargy Today, extremely tired, sleepy, less responsive.  No fever.  -Stop oxycodone 10 mg TID (this dose was verified again with SNF MAR) -Check ABG, CBG, BMP, CBC  Permanent atrial fibrillation CHA2DS2-Vasc 7 (hx dCHF, age, gender, history stroke, vascular disease/femoral embolism).  Confers 11% risk of stroke per year, 15% risk of stroke or systemic embolism.   -Stop diltiazem given EF -Start metoprolol -Continue digoxin -Start Eliquis in place of Plavix in 2 weeks given extensive size of stroke and risk of bleeding  COPD Chronic respiratory failure on Home O2 2.5L No active disese -Continue home O2  Hyperthyroidism -Continue methimazole  Dehydration Resolved  Chronic blood loss anemia -Continue iron  OSA  Severe protein calorie malnutrition -Consult dietitian  Other medications -Continue mirtazapine, fluoxetine -Continue PPI -Continue  potassium       DVT prophylaxis: Lovenox Code Status: FULL Family Communication: None present MDM and disposition Plan: The below labs and imaging reports were reviewed and summarized above.  Discussed with Neurology.    Patient presetned with left hemiparesis, left sided neglect, has large right PCAMCA stroke.  Needs anticoagulation, deferred for 2 weeks.  To SNF pending lcinicaly improvement.     Consultants:   Neurology  Procedures:   Echocardiogram Study Conclusions  - Left ventricle: The cavity size was normal. There was severe   focal basal hypertrophy of the septum. Systolic function was   mildly to moderately reduced. The estimated ejection fraction was   in the range of 40% to 45%. Hypokinesis of the anteroseptal and   inferoseptal myocardium. - Aortic valve: There was moderate regurgitation. Valve area   (Vmax): 1.61 cm^2. - Aorta: Ascending aortic diameter: 3.6 mm (S). - Ascending aorta: The ascending aorta was at the upper limit of   normal. - Mitral valve: Calcified annulus. Transvalvular velocity was   within the normal range. There was no evidence for stenosis.   There was mild regurgitation. Valve area by pressure half-time: 2   cm^2. - Left atrium: The atrium was severely dilated. - Right ventricle: The cavity size was normal. Wall thickness was   normal. Systolic function was normal. - Right atrium: The atrium was severely dilated. - Atrial septum: No defect or patent foramen ovale was identified. - Tricuspid valve: There was moderate regurgitation. - Pulmonary arteries: Systolic pressure was mildly increased. PA   peak pressure: 44 mm Hg (S).    Subjective: Still weak on left side.  Left sided neglect.  Much more sleepy today, less interactive,  less responsive to nursing cares.  No new fever, cough, respiraotry distress.  Objective: Vitals:   10/04/17 0940 10/04/17 1137 10/04/17 1233 10/04/17 1606  BP: (!) 137/101 (!) 146/114 (!) 143/103 (!)  156/98  Pulse: (!) 121 (!) 116 (!) 115 (!) 109  Resp:  17  18  Temp:  98.4 F (36.9 C)  98.1 F (36.7 C)  TempSrc:  Oral  Oral  SpO2: 100% 97%  100%  Weight:      Height:        Intake/Output Summary (Last 24 hours) at 10/04/2017 1632 Last data filed at 10/04/2017 0600 Gross per 24 hour  Intake 1120 ml  Output 200 ml  Net 920 ml   Filed Weights   10/02/17 1810  Weight: 45.8 kg (100 lb 15.5 oz)    Examination: General appearance: Elderly female, obtunded, lying on her side, tones to answer me. HEENT: Anicteric, conjunctiva pink, lids and lashes normal. No nasal deformity, discharge, epistaxis.  Lips dry, dentition good, OP dry, no oral lesions, hearing diminished.   Skin: Skin warm and dry, no jaundice, no suspicious rashes, lesions, induration, redness. Cardiac: Tachycardic, irregular, soft systolic murmur, no lower extremity edema. Respiratory: Respiratory effort diminished.  Lungs clear without rales or wheezes. Abdomen: Abdomen soft, no grimace to palpation, no ascites, distention, hepatosplenomegaly. MSK: No deformities or effusions in upper or lower extremity large joints.  Diffuse loss of subcutaneous muscle mass and fat. Neuro: Lying on her side, sleeping, minimally responsive.  Weak on the left side.  Appears to have left-sided neglect.    Psych: Unable to assess.    Data Reviewed: I have personally reviewed following labs and imaging studies:  CBC: Recent Labs  Lab 10/02/17 0951 10/02/17 0958 10/03/17 0727  WBC  --  10.8* 9.6  NEUTROABS  --  8.0*  --   HGB 17.0* 15.2* 13.6  HCT 50.0* 48.8* 43.9  MCV  --  89.5 89.6  PLT  --  299 276   Basic Metabolic Panel: Recent Labs  Lab 10/02/17 0951 10/02/17 0958 10/03/17 0727  NA 141 139 137  K 4.0 3.9 3.7  CL 106 105 106  CO2  --  18* 20*  GLUCOSE 148* 148* 113*  BUN 10 9 6   CREATININE 0.70 0.82 0.73  CALCIUM  --  8.9 8.2*   GFR: Estimated Creatinine Clearance: 36.5 mL/min (by C-G formula based on SCr  of 0.73 mg/dL). Liver Function Tests: Recent Labs  Lab 10/02/17 0958  AST 22  ALT 9*  ALKPHOS 163*  BILITOT 0.7  PROT 7.1  ALBUMIN 3.7   No results for input(s): LIPASE, AMYLASE in the last 168 hours. No results for input(s): AMMONIA in the last 168 hours. Coagulation Profile: Recent Labs  Lab 10/02/17 0958  INR 1.00   Cardiac Enzymes: No results for input(s): CKTOTAL, CKMB, CKMBINDEX, TROPONINI in the last 168 hours. BNP (last 3 results) No results for input(s): PROBNP in the last 8760 hours. HbA1C: Recent Labs    10/03/17 0727  HGBA1C 4.8   CBG: Recent Labs  Lab 10/02/17 0946 10/04/17 1527  GLUCAP 130* 112*   Lipid Profile: Recent Labs    10/03/17 0727  CHOL 209*  HDL 60  LDLCALC 120*  TRIG 145  CHOLHDL 3.5   Thyroid Function Tests: Recent Labs    10/03/17 0727  TSH 1.839   Anemia Panel: No results for input(s): VITAMINB12, FOLATE, FERRITIN, TIBC, IRON, RETICCTPCT in the last 72 hours. Urine analysis:  Component Value Date/Time   COLORURINE STRAW (A) 10/02/2017 1225   APPEARANCEUR CLEAR 10/02/2017 1225   LABSPEC 1.039 (H) 10/02/2017 1225   PHURINE 7.0 10/02/2017 1225   GLUCOSEU NEGATIVE 10/02/2017 1225   HGBUR NEGATIVE 10/02/2017 1225   BILIRUBINUR NEGATIVE 10/02/2017 1225   BILIRUBINUR negative 01/25/2017 1620   KETONESUR NEGATIVE 10/02/2017 1225   PROTEINUR NEGATIVE 10/02/2017 1225   UROBILINOGEN 0.2 01/25/2017 1620   NITRITE NEGATIVE 10/02/2017 1225   LEUKOCYTESUR NEGATIVE 10/02/2017 1225   Sepsis Labs: @LABRCNTIP (procalcitonin:4,lacticacidven:4)  ) Recent Results (from the past 240 hour(s))  Culture, Urine     Status: Abnormal   Collection Time: 10/02/17 12:25 PM  Result Value Ref Range Status   Specimen Description URINE, RANDOM  Final   Special Requests   Final    NONE Performed at [facility], [address] [place], [place] [address]    Culture MULTIPLE SPECIES PRESENT, SUGGEST RECOLLECTION (A)  Final   Report  Status 10/03/2017 FINAL  Final  MRSA PCR Screening     Status: Abnormal   Collection Time: 10/02/17 11:15 PM  Result Value Ref Range Status   MRSA by PCR POSITIVE (A) NEGATIVE Final    Comment:        The GeneXpert MRSA Assay (FDA approved for NASAL specimens only), is one component of a comprehensive MRSA colonization surveillance program. It is not intended to diagnose MRSA infection nor to guide or monitor treatment for MRSA infections. RESULT CALLED TO, READ BACK BY AND VERIFIED WITH: WORTH,S RN [phone] 10/03/17 MITCHELL,L          Radiology Studies: Mr Brain Wo Contrast  Result Date: 10/03/2017 CLINICAL DATA:  Stroke follow-up EXAM: MRI HEAD WITHOUT CONTRAST TECHNIQUE: Multiplanar, multiecho pulse sequences of the brain and surrounding structures were obtained without intravenous contrast. COMPARISON:  Head CT, CTA, and cerebral perfusion FINDINGS: Brain: Confluent restricted diffusion in the right occipital cortex, medial right temporal lobe at the posterior hippocampus, right thalamus, splenium of the corpus callosum, and patchy along the right frontal parietal and superficial temporal cortex. Confluent FLAIR hyperintensity in the cerebral white matter attributed to severe chronic small vessel ischemia given demographics and history. Remote small vessel infarcts in the bilateral cerebellum. T2 hyperintensity in the deep gray nuclei and pons without noted expansion, likely also chronic and microvascular. Mild generalized atrophy. Hemorrhagic appearance along the infarcts considered petechial. No mass or hydrocephalus.  Mild for age atrophy. Vascular: Major flow voids are preserved. Skull and upper cervical spine: No evidence of marrow lesion. Sinuses/Orbits: Bilateral cataract resection. Patchy mucosal thickening in the ethmoids and fluid in the right maxillary sinus. IMPRESSION: 1. Extensive acute infarct in the right MCA and right PCA (which is fetal type) distribution as described.  Patchy petechial hemorrhage. 2. Extensive white matter disease that is likely chronic small vessel ischemia. 3. Multiple small remote cerebellar infarcts. Electronically Signed   By: Marnee Spring M.D.   On: 10/03/2017 19:02        Scheduled Meds: .  stroke: mapping our early stages of recovery book   Does not apply Once  . atorvastatin  40 mg Oral q1800  . clopidogrel  75 mg Oral Daily  . digoxin  0.125 mg Oral Daily  . enoxaparin (LOVENOX) injection  40 mg Subcutaneous Q24H  . feeding supplement (ENSURE ENLIVE)  237 mL Oral BID BM  . ferrous gluconate  324 mg Oral TID WC  . FLUoxetine  20 mg Oral Daily  . loratadine  10  mg Oral Daily  . methimazole  5 mg Oral Daily  . metoprolol tartrate  25 mg Oral BID  . mirtazapine  15 mg Oral QHS  . pantoprazole  40 mg Oral Daily  . potassium chloride SA  20 mEq Oral Daily   Continuous Infusions:    LOS: 2 days    Time spent: 35 minutes    Alberteen Sam, MD [facility] 10/04/2017, 1:25 PM    Pager [phone] --- please page though AMION:  [email or web address] Password [email or web address] If 7PM-7AM, please contact night-coverage

## 2017-10-04 NOTE — Care Management Note (Signed)
Case Management Note  Patient Details  Name: Madison Roberson MRN: [id number] Date of Birth: 11/16/1929  Subjective/Objective:     Pt admitted with CVA. She is from [facility].               Action/Plan: PT/OT recommending SNF. CM following for d/c disposition.   Expected Discharge Date:                  Expected Discharge Plan:  Skilled Nursing Facility  In-House Referral:  Clinical Social Work  Discharge planning Services     Post Acute Care Choice:    Choice offered to:     DME Arranged:    DME Agency:     HH Arranged:    HH Agency:     Status of Service:  In process, will continue to follow  If discussed at Long Length of Stay Meetings, dates discussed:    Additional Comments:  Kermit BaloKelli F Gizell Danser, RN 10/04/2017, 9:27 AM

## 2017-10-04 NOTE — Evaluation (Signed)
Speech Language Pathology Evaluation Patient Details Name: Madison Roberson MRN: [id number] DOB: [birth year] Today's Date: 10/04/2017 Time: 4782-9562 SLP Time Calculation (min) (ACUTE ONLY): 24 min  Problem List:  Patient Active Problem List   Diagnosis Date Noted  . Acute CVA (cerebrovascular accident) (HCC) 10/02/2017  . History of CVA (cerebrovascular accident) 10/02/2017  . Atrial fibrillation (HCC) 10/02/2017  . COPD (chronic obstructive pulmonary disease) (HCC) 10/02/2017  . Chronic respiratory failure with hypoxia (HCC) 10/02/2017  . Hyperthyroidism 10/02/2017  . Frequent falls 10/02/2017  . Protein calorie malnutrition (HCC) 10/02/2017  . Fe deficiency anemia 10/02/2017  . Acute dehydration 10/02/2017  . OSA (obstructive sleep apnea) 10/02/2017  . Alteration in anticoagulation    Past Medical History:  Past Medical History:  Diagnosis Date  . 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 Atrial fibrillation (HCC)   . Chronic respiratory failure with hypoxia (HCC)   . COPD (chronic obstructive pulmonary disease) (HCC)   . DVT (deep venous thrombosis) (HCC)   . Fe deficiency anemia   . Former tobacco use    quit 25 years ago  . Frequent falls   . History of cerebellar CVA (cerebrovascular accident) 07/31/2015  . History of GI bleed 10/15/2015  . Hyperthyroidism   . OSA (obstructive sleep apnea)   . Protein calorie malnutrition (HCC)    Past Surgical History:  Past Surgical History:  Procedure Laterality Date  . BACK SURGERY     2 Broken vertebrae  . HERNIA REPAIR    . IM NAILING FEMORAL SHAFT FRACTURE Left 07/25/2017  . PERIPHERAL VASCULAR THROMBECTOMY     2015 & 2018   HPI:  Madison Roberson an 84 y.o. femalepresenting from her SNFafter staff found her to be hemiplegic on theleft and with slurred speech. PMHx includes history previous stroke, obstructive sleep apnea, hypothyroidism, a previous GI bleed, frequent falls, previous DVT, COPD, atrial  fibrillation, and previous arterial embolism to the left leg. MRI (4/14) revealed extensive acute infarct in the right MCA and right PCA, patchy petechial hemorrhage, extensive white matter disease that is likely chronic small vessel ischemia, and multiple small remote cerebellar infarcts. Pt passed the stroke swallow screen.   Assessment / Plan / Recommendation Clinical Impression   Pt presents with Mild deficits in sequencing, thought organization, orientation, processing speed, and intellectual awareness. SLP provided Mod cues, including repetition and verbal cues for selective attention, to facilitate completion of basic verbal tasks. Pt demonstrated basic functional problem solving and emerging safety awareness by demonstrating understanding of call bell. Pt had moderately impaired left attention, with pt benefiting from Mod visual cues and Min verbal cues to attend to her left. Pt's speech was moderately slowed, but intelligible throughout assessment. Recommend follow up SLP intervention at next venue of care to further address identified deficits and maximize pt's independence.     SLP Assessment  SLP Recommendation/Assessment: All further Speech Lanaguage Pathology  needs can be addressed in the next venue of care SLP Visit Diagnosis: Cognitive communication deficit (R41.841)    Follow Up Recommendations  Skilled Nursing facility    Frequency and Duration           SLP Evaluation Cognition  Overall Cognitive Status: Impaired/Different from baseline Arousal/Alertness: Awake/alert Orientation Level: Oriented to person;Oriented to place;Oriented to situation;Disoriented to time Attention: Sustained;Selective Sustained Attention: Appears intact Selective Attention: Impaired Selective Attention Impairment: Verbal basic Awareness: Impaired Awareness Impairment: Intellectual impairment Problem Solving: Impaired Problem Solving Impairment: Verbal basic Executive Function:  Sequencing;Organizing Sequencing: Impaired Sequencing  Impairment: Verbal basic Organizing: Impaired Organizing Impairment: Verbal basic Safety/Judgment: Appears intact       Comprehension  Auditory Comprehension Overall Auditory Comprehension: Appears within functional limits for tasks assessed Visual Recognition/Discrimination Discrimination: Not tested Reading Comprehension Reading Status: Not tested    Expression Expression Primary Mode of Expression: Verbal Verbal Expression Overall Verbal Expression: Appears within functional limits for tasks assessed Written Expression Written Expression: Not tested   Oral / Motor  Motor Speech Overall Motor Speech: Impaired(intelligible but slowed rate)   GO                   [place] Kaedon Fanelli SLP Student Clinician  [place] Rushi Chasen 10/04/2017, 4:57 PM

## 2017-10-04 NOTE — Progress Notes (Signed)
STROKE TEAM PROGRESS NOTE   HISTORY OF PRESENT ILLNESS (per record) Madison Roberson is an 84 y.o. female presenting from her SNF after staff found her to be hemiplegic on the left and with slurred speech this morning. LKN was 2100. On EMS arrival her BP was 138/91. Per EMS, she was drooling on the left, had left facial droop, not moving or paying attention to her left side. She was in a-fib at the time of EMS assessment with HR as high as 170, which later decreased to 100. CBG was 174. She endorsed no symptoms except for SOB and cough. No headache.   LSN: [id number] tPA Given: No: Out of time window   SUBJECTIVE (INTERVAL HISTORY) No one is at the bedside. The patient is sleepy today she still has left neglect and hemianopia with right gaze preference. She passed swallow and  Is on diet and plavix. MRI  Shows extensive acute infarcts in right MCA and right PCA territory with patchy petechial hemorrhage. Extensive changes of chronic white matter disease and multiple small remote cerebellar infarcts   OBJECTIVE Temp:  [97.6 F (36.4 C)-98.7 F (37.1 C)] 98.4 F (36.9 C) (04/15 1137) Pulse Rate:  [81-121] 115 (04/15 1233) Cardiac Rhythm: Atrial fibrillation (04/15 1200) Resp:  [16-18] 17 (04/15 1137) BP: (137-160)/(90-114) 143/103 (04/15 1233) SpO2:  [97 %-100 %] 97 % (04/15 1137)  CBC:  Recent Labs  Lab 10/02/17 0958 10/03/17 0727  WBC 10.8* 9.6  NEUTROABS 8.0*  --   HGB 15.2* 13.6  HCT 48.8* 43.9  MCV 89.5 89.6  PLT 299 276    Basic Metabolic Panel:  Recent Labs  Lab 10/02/17 0958 10/03/17 0727  NA 139 137  K 3.9 3.7  CL 105 106  CO2 18* 20*  GLUCOSE 148* 113*  BUN 9 6  CREATININE 0.82 0.73  CALCIUM 8.9 8.2*    Lipid Panel:     Component Value Date/Time   CHOL 209 (H) 10/03/2017 0727   TRIG 145 10/03/2017 0727   HDL 60 10/03/2017 0727   CHOLHDL 3.5 10/03/2017 0727   VLDL 29 10/03/2017 0727   LDLCALC 120 (H) 10/03/2017 0727   HgbA1c:  Lab Results  Component  Value Date   HGBA1C 4.8 10/03/2017   Urine Drug Screen: No results found for: LABOPIA, COCAINSCRNUR, LABBENZ, AMPHETMU, THCU, LABBARB  Alcohol Level No results found for: [facility]   IMAGING I have personally reviewed the radiological images below and agree with the radiology interpretations.  Ct Angio Head W Or Wo Contrast Ct Angio Neck W Or Wo Contrast Ct Cerebral Perfusion W Contrast 10/02/2017 IMPRESSION:  1. Constellation of findings suspicious for a right MCA M2 occlusion at the trifurcation, although no discrete occluded vessel is identified on the CTA. Some artifactual findings on CT perfusion are suspected, but CTP suggests a posterior right MCA territory penumbra with no core infarct detected.  2. The above was discussed by telephone with Dr. Caryl Pina on 10/02/2017 at 1024 hours.  3. Underlying generalized Arterial Ectasia, including ectatic aortic arch (see #5), bilateral carotid arteries, and aberrant right subclavian artery. Atherosclerosis, but no hemodynamically significant stenosis in the neck or at the skull base.  4. Aortic Atherosclerosis (ICD10-I70.0) and Emphysema (ICD10-J43.9).  5. Aneurysmal descending thoracic aorta, 40 millimeters diameter. Recommend annual imaging followup by CTA or MRA.   Ct Head Code Stroke Wo Contrast 10/02/2017 IMPRESSION:  1. No acute cortically based infarct or acute intracranial hemorrhage identified. ASPECTS 10.  2. Chronic small vessel disease with  age indeterminate involvement of the right pons.  3. Left greater than right cerebellar infarcts, probably chronic.   Transthoracic Echocardiogram - Left ventricle: The cavity size was normal. There was severe   focal basal hypertrophy of the septum. Systolic function was   mildly to moderately reduced. The estimated ejection fraction was   in the range of 40% to 45%. Hypokinesis of the anteroseptal and   inferoseptal myocardium. - Aortic valve: There was moderate regurgitation. Valve area    (Vmax): 1.61 cm^2. - Aorta: Ascending aortic diameter: 3.6 mm (S). - Ascending aorta: The ascending aorta was at the upper limit of   normal. - Mitral valve: Calcified annulus. Transvalvular velocity was   within the normal range. There was no evidence for stenosis.   There was mild regurgitation. Valve area by pressure half-time: 2   cm^2. - Left atrium: The atrium was severely dilated. - Right ventricle: The cavity size was normal. Wall thickness was   normal. Systolic function was normal. - Right atrium: The atrium was severely dilated. - Atrial septum: No defect or patent foramen ovale was identified. - Tricuspid valve: There was moderate regurgitation. - Pulmonary arteries: Systolic pressure was mildly increased. PA   peak pressure: 44 mm Hg (S).  MRI 1. Extensive acute infarct in the right MCA and right PCA (which is fetal type) distribution as described. Patchy petechial hemorrhage. 2. Extensive white matter disease that is likely chronic small vessel ischemia. 3. Multiple small remote cerebellar infarcts.   PHYSICAL EXAM Vitals:   10/04/17 0801 10/04/17 0940 10/04/17 1137 10/04/17 1233  BP: (!) 140/108 (!) 137/101 (!) 146/114 (!) 143/103  Pulse: (!) 120 (!) 121 (!) 116 (!) 115  Resp: 18  17   Temp: 98.5 F (36.9 C)  98.4 F (36.9 C)   TempSrc: Oral  Oral   SpO2: 99% 100% 97%   Weight:      Height:       General - pleasant rail elderly Caucasian lady somewhat hard of hearing   in no acute distress. Heart - irregularly irregular rhythm - no murmer appreciated Lungs - Clear to auscultation anteriorly Abdomen - Soft - non tender Extremities - Distal pulses intact - no edema Skin - Warm and dry  Mental Status:  lethargic but can be aroused. Does not follow commands consistently.   mild dysarthria    Able to follow simple 1step commands with cueing. Left hemi-neglect. Cranial Nerves: II: Discs not visualized; left hemianopia, pupils equal, round, reactive to light.   III,IV, VI: ptosis not present, extra-ocular motions intact bilaterally V,VII: left facial droop, facial light touch sensation normal bilaterally VIII: hard of hearing bilaterally IX,X: gag reflex present XI: bilateral shoulder shrug intact. XII: midline tongue extension Motor: RUE - 4/5    LUE - 3/5 RLE - 4/5    LLE -  3-/5 Tone and bulk:normal tone throughout; no atrophy noted Sensory: Light touch intact throughout, bilaterally Cerebellar: normal finger-to-nose right RUE and slow LUE Gait: not tested    ASSESSMENT/PLAN Ms. Madison Roberson is a 84 y.o. 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 She did not receive IV t-PA due to late presentation.  Stroke: right MCA infarct due to afib not on AC   Resultant - left hemiparesis, and left neglect  CT head -  no acute infarct. Left greater than right cerebellar infarcts, probably chronic.  MRI head - 1. Extensive acute infarct in the right MCA and right PCA (which is fetal type) distribution as described. Patchy petechial hemorrhage. 2. Extensive white matter disease that is likely chronic small vessel ischemia.  3. Multiple small remote cerebellar infarcts.  CTA H&N - but no large vessel occlusion found  CTP - right MCA territory penumbra but no core infarct  Carotid Doppler - CTA neck  2D Echo - EF 40-45%  LDL 120  HgbA1c - 4.8  VTE prophylaxis - lovenox Fall precautions Diet Heart Room service appropriate? Yes; Fluid consistency: Thin  aspirin 81 mg daily and Xarelto (rivaroxaban) daily prior to admission, now on clopidogrel 75 mg daily.    Ongoing aggressive stroke risk factor management  Therapy recommendations:  SNF  Disposition:  Pending  Afib  Was on  Xarelto  Stopped after recent fall s/p left hip placement  On ASA PTA  Now on plavix  Dr Roda ShuttersXu discussed with pt and daughter, since pt at this time no fall risk, will consider eliquis (given previous GIB) if MRI did not show large infarct. Once eliquis starts, plavix can be discontinued.  Hypertension  Stable . Permissive hypertension (OK if < 220/120) but gradually normalize in 5-7 days . Long-term BP goal normotensive  Hyperlipidemia  Lipid lowering medication PTA:  none  LDL 120, goal < 70  Current lipid lowering medication: Lipitor 40 mg daily  Continue statin at discharge   Other Stroke Risk Factors  Advanced age  Former cigarette smoker - quit 25 years ago  Hx stroke/TIA  Obstructive sleep apnea  Other Active Problems  HOH  Mild confusion  Contact precautions - MRSA  Previous GI bleed history in 2017  Previous DVT history  History of frequent falls  COPD / emphysema  Mild leukocytosis - afebrile   Aneurysmal descending thoracic aorta, 40 millimeters diameter. Recommend annual imaging followup by CTA or MRA   Hospital day # 2  I have personally examined this patient, reviewed notes, independently viewed imaging studies, participated in medical decision making and plan of care.ROS completed by me personally and pertinent positives fully documented  I have made any additions or clarifications directly to the above note.  Recommend Plavix for now but after 1 week consider switching to eliquis for anticoagulation. Transfer to skilled nursing facility for rehabilitation when bed available. Stroke team will sign off at the present time. Kindly call for questions. Discussed with Dr. Maryfrances Bunnellanford. Family not available at the bedside for discussion.greater than 50% time during this 25 minute visit was spent on coordination of care about patient's stroke and planning treatment and answering questions.  Delia HeadyPramod Ledford Goodson, MD Medical Director [facility] Pager: [phone] 10/04/2017 3:17 PM     To contact Stroke Continuity provider, please refer to [email or web address]. After hours, contact General Neurology

## 2017-10-05 ENCOUNTER — Emergency Department (HOSPITAL_BASED_OUTPATIENT_CLINIC_OR_DEPARTMENT_OTHER)
Admission: EM | Admit: 2017-10-05 | Discharge: 2017-10-05 | Disposition: A | Discharge status: Home / Self Care | Payer: Medicare Other | Attending: Emergency Medicine | Admitting: Emergency Medicine

## 2017-10-05 ENCOUNTER — Other Ambulatory Visit: Payer: Self-pay

## 2017-10-05 ENCOUNTER — Encounter (HOSPITAL_BASED_OUTPATIENT_CLINIC_OR_DEPARTMENT_OTHER): Payer: Self-pay | Admitting: Emergency Medicine

## 2017-10-05 DIAGNOSIS — J449 Chronic obstructive pulmonary disease, unspecified: Secondary | ICD-10-CM | POA: Insufficient documentation

## 2017-10-05 DIAGNOSIS — R112 Nausea with vomiting, unspecified: Secondary | ICD-10-CM | POA: Insufficient documentation

## 2017-10-05 DIAGNOSIS — Z79899 Other long term (current) drug therapy: Secondary | ICD-10-CM | POA: Insufficient documentation

## 2017-10-05 DIAGNOSIS — E860 Dehydration: Secondary | ICD-10-CM

## 2017-10-05 MED ORDER — ATORVASTATIN CALCIUM 40 MG PO TABS: 40.0000 mg | ORAL_TABLET | Freq: Every day | ORAL | 0 refills | Status: AC

## 2017-10-05 MED ORDER — ONDANSETRON 4 MG PO TBDP: 4.0000 mg | ORAL_TABLET | ORAL | 0 refills | Status: AC | PRN

## 2017-10-05 MED ORDER — ONDANSETRON 4 MG PO TBDP
4.0000 mg | ORAL_TABLET | Freq: Once | ORAL | Status: AC
  Administered 2017-10-05: 4 mg via ORAL
  Filled 2017-10-05: qty 1

## 2017-10-05 MED ORDER — OXYCODONE HCL 5 MG PO TABS: 5.0000 mg | ORAL_TABLET | Freq: Three times a day (TID) | ORAL | 0 refills | Status: AC | PRN

## 2017-10-05 MED ORDER — METOPROLOL TARTRATE 25 MG PO TABS: 25.0000 mg | ORAL_TABLET | Freq: Two times a day (BID) | ORAL | 0 refills | Status: AC

## 2017-10-05 MED ORDER — ENSURE ENLIVE PO LIQD: 237.0000 mL | Freq: Two times a day (BID) | ORAL | 12 refills | Status: AC

## 2017-10-05 MED ORDER — CLOPIDOGREL BISULFATE 75 MG PO TABS: 75.0000 mg | ORAL_TABLET | Freq: Every day | ORAL | 0 refills | Status: AC

## 2017-10-05 NOTE — Progress Notes (Signed)
[facility] Anticipated discharge date:4/16 Family notified: Claris CheMargaret Transportation by [facility]- called at 12:35pm Report#: [id number] Rm [id number]  CSW signing off.  Burna SisJenna H. Vollie Aaron, LCSW Clinical Social Worker [phone]

## 2017-10-05 NOTE — ED Notes (Signed)
[facility] called for transport to [facility].

## 2017-10-05 NOTE — Clinical Social Work Placement (Signed)
   CLINICAL SOCIAL WORK PLACEMENT  NOTE  Date:  10/05/2017  Patient Details  Name: Madison Roberson MRN: [id number] Date of Birth: 01/07/1930  Clinical Social Work is seeking post-discharge placement for this patient at the Skilled  Nursing Facility level of care (*CSW will initial, date and re-position this form in  chart as items are completed):  Yes   Patient/family provided with [place] Clinical Social Work Department's list of facilities offering this level of care within the geographic area requested by the patient (or if unable, by the patient's family).  Yes   Patient/family informed of their freedom to choose among providers that offer the needed level of care, that participate in Medicare, Medicaid or managed care program needed by the patient, have an available bed and are willing to accept the patient.  Yes   Patient/family informed of [facility]'s ownership interest in [facility] and [facility], as well as of the fact that they are under no obligation to receive care at these facilities.  PASRR submitted to EDS on       PASRR number received on       Existing PASRR number confirmed on 10/04/17     FL2 transmitted to all facilities in geographic area requested by pt/family on 10/04/17     FL2 transmitted to all facilities within larger geographic area on       Patient informed that his/her managed care company has contracts with or will negotiate with certain facilities, including the following:        Yes   Patient/family informed of bed offers received.  [facility]     Physician recommends and patient chooses bed at      [facility] [place] on 10/05/17.  Patient to be transferred to facility by ptar     Patient family notified on 10/05/17 of transfer.  Name of family member notified:  margaret     PHYSICIAN Please sign FL2     Additional Comment:     _______________________________________________ Burna SisUris, Berle Fitz H, LCSW 10/05/2017, 12:41 PM

## 2017-10-05 NOTE — ED Triage Notes (Signed)
Patient discharged [facility] via [facility].  Upon riding back to [facility] patient vomited in ambulance-[facility] unable to accept patient.  EMS called.

## 2017-10-05 NOTE — ED Notes (Signed)
[facility]AR arrived.  Patient made aware of PTAR's arrival.

## 2017-10-05 NOTE — Discharge Instructions (Addendum)
1.  At this time, it appears most likely her symptoms were due to motion sickness.  Take Zofran every 4-6 hours as needed for nausea. 2.  If your symptoms are persisting or worsening or new symptoms develop, return to the emergency department for reassessment.

## 2017-10-05 NOTE — Progress Notes (Signed)
Physical Therapy Treatment Patient Details Name: Madison Roberson MRN: [id number] DOB: 11/01/1929 Today's Date: 10/05/2017    History of Present Illness Pt is an 84 y.o. 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 Pt presented to the ED after being found down at SNF with L sided weakness, L facial droop, and L neglect. CT perfusion revealed likely embolic R MCA CVA with M2 occlusion.     PT Comments    Patient is making gradual progress toward mobility goals. Pt eager to participate in therapy however limited by low back pain. Continue to progress as tolerated with anticipated d/c to SNF for further skilled PT services.     Follow Up Recommendations  SNF     Equipment Recommendations  None recommended by PT    Recommendations for Other Services       Precautions / Restrictions Precautions Precautions: Fall Restrictions Weight Bearing Restrictions: Yes LLE Weight Bearing: Weight bearing as tolerated    Mobility  Bed Mobility Overal bed mobility: Needs Assistance Bed Mobility: Rolling;Sidelying to Sit Rolling: Min assist Sidelying to sit: Min assist       General bed mobility comments: step by step cues for sequencing and use of bed rail to assist in elevating trunk into sitting; assist to bring hips to EOB with use of bed pad  Transfers Overall transfer level: Needs assistance Equipment used: Rolling walker (2 wheeled) Transfers: Sit to/from Stand Sit to Stand: Mod assist         General transfer comment: assist to power up into standing and to gain balance upon standing; pt using bilat LE to brace against  surface; cues for safe hand placement  Ambulation/Gait Ambulation/Gait assistance: Mod assist Ambulation Distance (Feet): 12 Feet Assistive device: Rolling walker (2 wheeled) Gait Pattern/deviations: Decreased step length - right;Decreased step length - left;Decreased stance time - left;Trunk flexed;Narrow base of support Gait velocity: decreased   General Gait Details: cues for sequencing and multimodal cues for posture; assist for balance; limited by back pain   Stairs             Wheelchair Mobility    Modified Rankin (Stroke Patients Only) Modified Rankin (Stroke Patients Only) Pre-Morbid Rankin Score: Moderately severe disability Modified Rankin: Moderately severe disability     Balance Overall balance assessment: Needs assistance Sitting-balance support: Bilateral upper extremity supported;Feet supported Sitting balance-Leahy Scale: Fair Sitting balance - Comments: Supervision at EOB   Standing balance support: Bilateral upper extremity supported Standing balance-Leahy Scale: Poor                              Cognition Arousal/Alertness: Awake/alert Behavior During Therapy: WFL for tasks assessed/performed Overall Cognitive Status: No family/caregiver present to determine baseline cognitive functioning Area of Impairment: Attention;Following commands;Safety/judgement;Problem solving                   Current Attention Level: Focused   Following Commands: Follows one step commands consistently;Follows one step commands with increased time Safety/Judgement: Decreased awareness of safety;Decreased awareness of deficits   Problem Solving: Slow processing;Difficulty sequencing;Requires verbal cues;Decreased initiation        Exercises General Exercises -  Lower Extremity Long Arc Quad: AROM;Both;10 reps;Seated Hip Flexion/Marching: AROM;Both;10 reps;Seated    General Comments        Pertinent Vitals/Pain Pain Assessment: Faces Faces  Pain Scale: Hurts little more Pain Location: lumbar  Pain Descriptors / Indicators: Guarding;Aching;Sore Pain Intervention(s): Limited activity within patient's tolerance;Monitored during session;Repositioned    Home Living                      Prior Function            PT Goals (current goals can now be found in the care plan section) Acute Rehab PT Goals PT Goal Formulation: With patient Time For Goal Achievement: 10/17/17 Potential to Achieve Goals: Good Progress towards PT goals: Progressing toward goals    Frequency    Min 3X/week      PT Plan Current plan remains appropriate    Co-evaluation              AM-PAC PT "6 Clicks" Daily Activity  Outcome Measure  Difficulty turning over in bed (including adjusting bedclothes, sheets and blankets)?: Unable Difficulty moving from lying on back to sitting on the side of the bed? : Unable Difficulty sitting down on and standing up from a chair with arms (e.g., wheelchair, bedside commode, etc,.)?: Unable Help needed moving to and from a bed to chair (including a wheelchair)?: A Lot Help needed walking in hospital room?: A Lot Help needed climbing 3-5 steps with a railing? : Total 6 Click Score: 8    End of Session Equipment Utilized During Treatment: Gait belt;Oxygen Activity Tolerance: Patient limited by pain;Other (comment)(back pain) Patient left: in chair;with call bell/phone within reach;with chair alarm set Nurse Communication: Mobility status PT Visit Diagnosis: Unsteadiness on feet (R26.81);Other abnormalities of gait and mobility (R26.89);History of falling (Z91.81);Hemiplegia and hemiparesis Hemiplegia - Right/Left: Left Hemiplegia - caused by: Cerebral infarction     Time: 1125-1150 PT Time Calculation (min) (ACUTE ONLY): 25 min  Charges:  $Gait Training: 8-22 mins $Therapeutic Activity: 8-22 mins                    G Codes:       Erline Levine, PTA Pager: [phone]      Carolynne Edouard 10/05/2017, 1:39 PM

## 2017-10-05 NOTE — Care Management Note (Signed)
Case Management Note  Patient Details  Name: Madison Roberson MRN: [id number] Date of Birth: 08/28/1929  Subjective/Objective:                    Action/Plan: [facility]. CM signing off.   Expected Discharge Date:  10/05/17               Expected Discharge Plan:  Skilled Nursing Facility  In-House Referral:  Clinical Social Work  Discharge planning Services     Post Acute Care Choice:    Choice offered to:     DME Arranged:    DME Agency:     HH Arranged:    HH Agency:     Status of Service:  Completed, signed off  If discussed at [employer] Meetings, dates discussed:    Additional Comments:  Kermit BaloKelli F Georjean Toya, RN 10/05/2017, 2:16 PM

## 2017-10-05 NOTE — ED Notes (Signed)
Report called to [facility]

## 2017-10-05 NOTE — ED Notes (Signed)
Patient arrived via EMS. Patient was already on a bedpan prior to [facility].

## 2017-10-05 NOTE — ED Provider Notes (Signed)
[facility] EMERGENCY DEPARTMENT Provider Note   CSN: [id number] Arrival date & time: 10/05/17  1525     History   Chief Complaint Chief Complaint  Patient presents with  . Emesis    HPI Madison Roberson is a 84 y.o. female.  HPI Patient was discharged today from [facility] for CVA.  She was on route in ambulance to [place] burn facility for rehabilitation.  Route, she started having vomiting.  They were advised to bring the patient to med center for evaluation before proceeding to SNF.  Patient reports the best she can figure, it was jiggly in the ambulance and she got nauseated and vomited.  She denies that she developed any pain.  She denies she had a headache or any new stroke symptoms.  She did not experience any visual changes.  She has been having some ongoing weakness and dysfunction on the left after her stroke.  Does not know that that is any different from when she left the hospital.  Denies she was having problems with abdominal pain.  Reports she feels fine now. Past Medical History:  Diagnosis Date  . 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 Atrial fibrillation (HCC)   . Chronic respiratory failure with hypoxia (HCC)   . COPD (chronic obstructive pulmonary disease) (HCC)   . DVT (deep venous thrombosis) (HCC)   . Fe deficiency anemia   . Former tobacco use    quit 25 years ago  . Frequent falls   . History of cerebellar CVA (cerebrovascular accident) 07/31/2015  . History of GI bleed 10/15/2015  . Hyperthyroidism   . OSA (obstructive sleep apnea)   . Protein calorie malnutrition [facility])     Patient Active Problem List   Diagnosis Date Noted  . Acute CVA (cerebrovascular accident) (HCC) 10/02/2017  . History of CVA (cerebrovascular accident) 10/02/2017  . Atrial fibrillation (HCC) 10/02/2017  . COPD (chronic obstructive pulmonary disease) (HCC) 10/02/2017  . Chronic respiratory failure with hypoxia (HCC) 10/02/2017  . Hyperthyroidism  10/02/2017  . Frequent falls 10/02/2017  . Protein calorie malnutrition (HCC) 10/02/2017  . Fe deficiency anemia 10/02/2017  . Acute dehydration 10/02/2017  . OSA (obstructive sleep apnea) 10/02/2017  . Alteration in anticoagulation     Past Surgical History:  Procedure Laterality Date  . BACK SURGERY     2 Broken vertebrae  . HERNIA REPAIR    . IM NAILING FEMORAL SHAFT FRACTURE Left 07/25/2017  . PERIPHERAL VASCULAR THROMBECTOMY     2015 & 2018     OB History   None      Home Medications    Prior to Admission medications   Medication Sig Start Date End Date Taking? Authorizing Provider  acetaminophen (TYLENOL) 650 MG CR tablet Take 650 mg by mouth 3 (three) times daily as needed for pain.    [provider]  atorvastatin (LIPITOR) 40 MG tablet Take 1 tablet (40 mg total) by mouth daily at 6 PM. 10/05/17   Danford, Earl Lites, MD  cetirizine (ZYRTEC) 10 MG tablet Take 10 mg by mouth daily.     [provider]  clopidogrel (PLAVIX) 75 MG tablet Take 1 tablet (75 mg total) by mouth daily. 10/06/17   Danford, Earl Lites, MD  digoxin (LANOXIN) 0.125 MG tablet Take 0.125 mg by mouth daily.     [provider]  feeding supplement, ENSURE ENLIVE, (ENSURE ENLIVE) LIQD Take 237 mLs by mouth 2 (two) times daily between meals. 10/05/17  Danford, Earl Liteshristopher P, MD  ferrous gluconate (FERGON) 225 (27 Fe) MG tablet Take 240 mg by mouth 3 (three) times daily with meals. 07/29/17   [provider]  FLUoxetine (PROZAC) 20 MG capsule Take 20 mg by mouth daily.    [provider]  methimazole (TAPAZOLE) 5 MG tablet Take 5 mg by mouth daily.    [provider]  metoprolol tartrate (LOPRESSOR) 25 MG tablet Take 1 tablet (25 mg total) by mouth 2 (two) times daily. 10/05/17   Danford, Earl Liteshristopher P, MD  mirtazapine (REMERON) 15 MG tablet Take 15 mg by mouth at bedtime.    [provider]  ondansetron (ZOFRAN ODT) 4 MG disintegrating  tablet Take 1 tablet (4 mg total) by mouth every 4 (four) hours as needed for nausea or vomiting. 10/05/17   Arby BarrettePfeiffer, Alline Pio, MD  oxyCODONE (OXY IR/ROXICODONE) 5 MG immediate release tablet Take 1 tablet (5 mg total) by mouth 3 (three) times daily as needed for moderate pain. 10/05/17   Danford, Earl Liteshristopher P, MD  OXYGEN Inhale 2.5 L into the lungs continuous as needed.    [provider]  potassium chloride SA (K-DUR,KLOR-CON) 20 MEQ tablet Take 20 mEq by mouth daily. 01/28/17   [provider]    Family History History reviewed. No pertinent family history.  Social History Social History   Tobacco Use  . Smoking status: Never Smoker  . Smokeless tobacco: Never Used  Substance Use Topics  . Alcohol use: No  . Drug use: No     Allergies   Lyrica [pregabalin]; Metoclopromide [metoclopramide]; Duloxetine; and Sulfa antibiotics   Review of Systems Review of Systems 10 Systems reviewed and are negative for acute change except as noted in the HPI.   Physical Exam Updated Vital Signs BP 116/78 (BP Location: Left Arm)   Pulse 75   Temp 98.1 F (36.7 C) (Axillary)   Resp (!) 28   SpO2 99%   Physical Exam  Constitutional: She is oriented to person, place, and time. She appears well-developed and well-nourished.  Patient is frail appearance but alert and nontoxic.  No respiratory distress.  HENT:  Head: Normocephalic and atraumatic.  Nose: Nose normal.  Mouth/Throat: Oropharynx is clear and moist.  Eyes: Pupils are equal, round, and reactive to light. EOM are normal.  Neck: Neck supple.  Cardiovascular: Normal rate, normal heart sounds and intact distal pulses.  Irregularly irregular.\6 systolic ejection murmur.  Pulmonary/Chest: Effort normal and breath sounds normal.  Abdominal: Soft. Bowel sounds are normal. She exhibits no distension. There is no tenderness.  Musculoskeletal: Normal range of motion. She exhibits no edema or tenderness.  Patient has large  ecchymoses on her forearms.  None on the lower extremity.  No severe peripheral edema.  Neurological: She is alert and oriented to person, place, and time. She has normal strength. Coordination normal. GCS eye subscore is 4. GCS verbal subscore is 5. GCS motor subscore is 6.  Patient is alert.  She is following commands.  She is situationally appropriate.  She is weaker to grip strength and lower extremity movement on the left.  She can perform grip but it is approximately 2\6 as compared to the right.  Reports this is how it has been.  No change.  Skin: Skin is warm, dry and intact.  Psychiatric: She has a normal mood and affect.     ED Treatments / Results  Labs (all labs ordered are listed, but only abnormal results are displayed) Labs Reviewed - No  data to display  EKG None  Radiology Mr Brain Wo Contrast  Result Date: 10/03/2017 CLINICAL DATA:  Stroke follow-up EXAM: MRI HEAD WITHOUT CONTRAST TECHNIQUE: Multiplanar, multiecho pulse sequences of the brain and surrounding structures were obtained without intravenous contrast. COMPARISON:  Head CT, CTA, and cerebral perfusion FINDINGS: Brain: Confluent restricted diffusion in the right occipital cortex, medial right temporal lobe at the posterior hippocampus, right thalamus, splenium of the corpus callosum, and patchy along the right frontal parietal and superficial temporal cortex. Confluent FLAIR hyperintensity in the cerebral white matter attributed to severe chronic small vessel ischemia given demographics and history. Remote small vessel infarcts in the bilateral cerebellum. T2 hyperintensity in the deep gray nuclei and pons without noted expansion, likely also chronic and microvascular. Mild generalized atrophy. Hemorrhagic appearance along the infarcts considered petechial. No mass or hydrocephalus.  Mild for age atrophy. Vascular: Major flow voids are preserved. Skull and upper cervical spine: No evidence of marrow lesion.  Sinuses/Orbits: Bilateral cataract resection. Patchy mucosal thickening in the ethmoids and fluid in the right maxillary sinus. IMPRESSION: 1. Extensive acute infarct in the right MCA and right PCA (which is fetal type) distribution as described. Patchy petechial hemorrhage. 2. Extensive white matter disease that is likely chronic small vessel ischemia. 3. Multiple small remote cerebellar infarcts. Electronically Signed   By: Marnee Spring M.D.   On: 10/03/2017 19:02    Procedures Procedures (including critical care time)  Medications Ordered in ED Medications  ondansetron (ZOFRAN-ODT) disintegrating tablet 4 mg (has no administration in time range)     Initial Impression / Assessment and Plan / ED Course  I have reviewed the triage vital signs and the nursing notes.  Pertinent labs & imaging results that were available during my care of the patient were reviewed by me and considered in my medical decision making (see chart for details).      Final Clinical Impressions(s) / ED Diagnoses   Final diagnoses:  Non-intractable vomiting with nausea, unspecified vomiting type   Patient was just discharged from the hospital and being transported to skilled nursing facility.  She became acutely nauseated and vomited.  She denies any other symptoms.  She suspects it was due to the ambulance ride.  At this time review of EMR indicates that labs and CBC were within normal limits yesterday.  At this time do not see indication to repeat today.  His vital signs are stable.  She has normal sinus rhythm on the monitor, normal oxygen saturation, normal blood pressure and is appropriately oriented to situation without evidence of new neurologic deficit. ED Discharge Orders        Ordered    ondansetron (ZOFRAN ODT) 4 MG disintegrating tablet  Every 4 hours PRN     10/05/17 1606       Arby Barrette, MD 10/05/17 1608

## 2019-04-30 IMAGING — MR MR HEAD W/O CM
9 of 10 series · 35 of 48 positions shown · non-contrast
Comparison: Head CT, CTA, and cerebral perfusion

CLINICAL DATA: Stroke follow-up

EXAM:
MRI HEAD WITHOUT CONTRAST
TECHNIQUE: Multiplanar, multiecho pulse sequences of the brain and surrounding
structures were obtained without intravenous contrast.

[Series 3: DWI · axial · 3.0mm · 0.94mm/px · z∈[-91,+56]mm · 8 of 100 slices shown (1 of 2)]
[im 1/100]
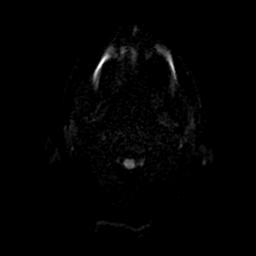
[im 15/100]
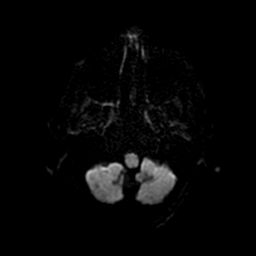
[im 29/100]
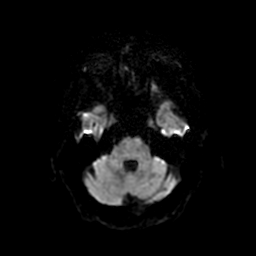
[im 43/100]
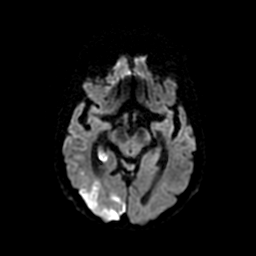
[im 57/100]
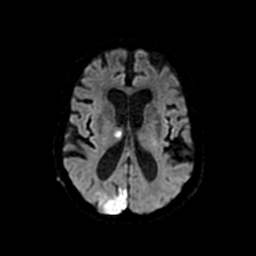
[im 71/100]
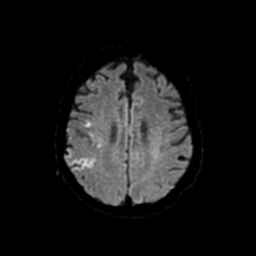
[im 85/100]
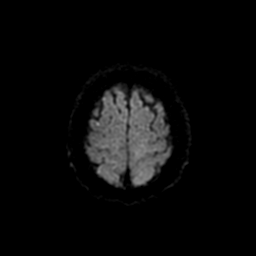
[im 100/100]
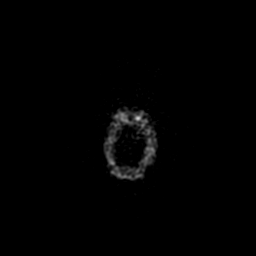

[Series 4: DWI · coronal · 4.0mm · 0.94mm/px · 7 of 70 slices shown (2 of 2)]
[im 1/70]
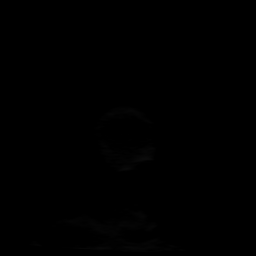
[im 12/70]
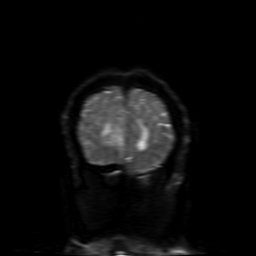
[im 24/70]
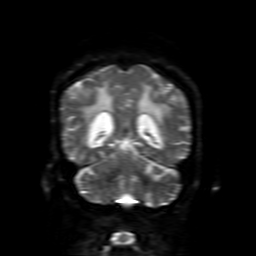
[im 35/70]
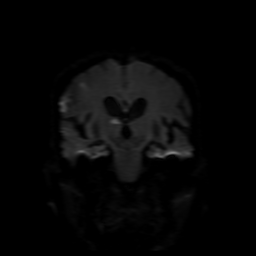
[im 47/70]
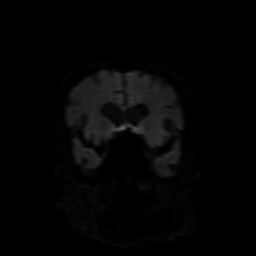
[im 58/70]
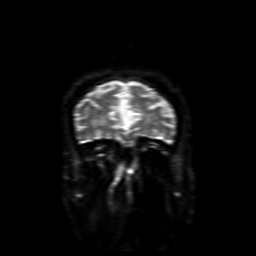
[im 70/70]
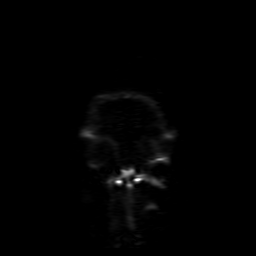

[Series 5: FLAIR · sagittal · 5.0mm · 0.47mm/px · 2 of 23 slices shown (1 of 2)]
[im 1/23]
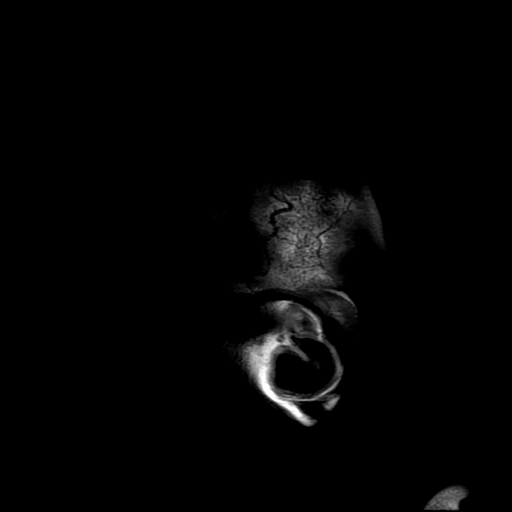
[im 23/23]
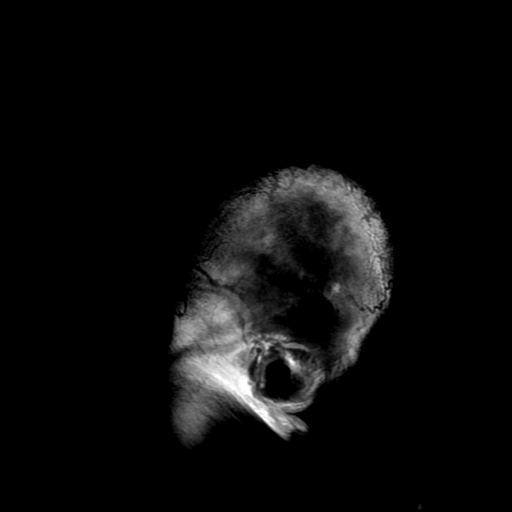

[Series 6: T2 · axial · 5.0mm · 0.47mm/px · z∈[-103,+59]mm · 3 of 28 slices shown (1 of 2)]
[im 1/28]
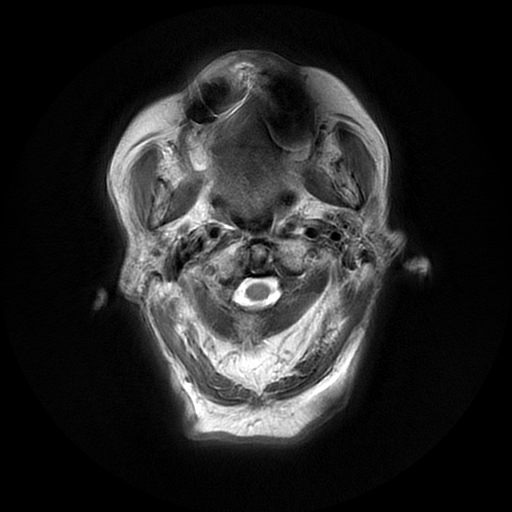
[im 14/28]
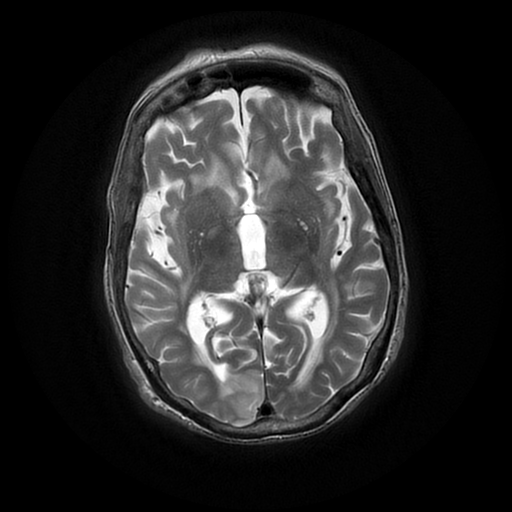
[im 28/28]
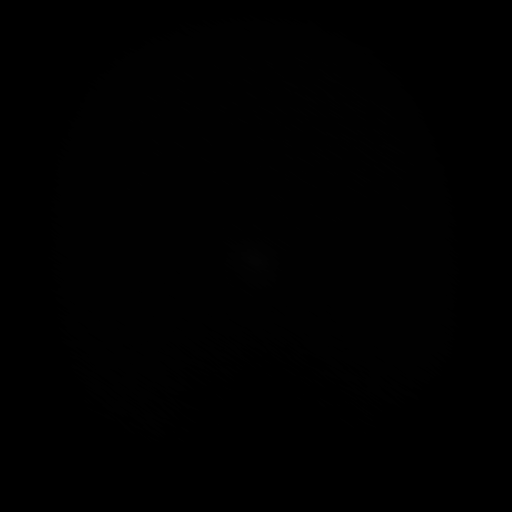

[Series 7: FLAIR · axial · 3.0mm · 0.45mm/px · z∈[-103,+59]mm · 3 of 28 slices shown (2 of 2)]
[im 1/28]
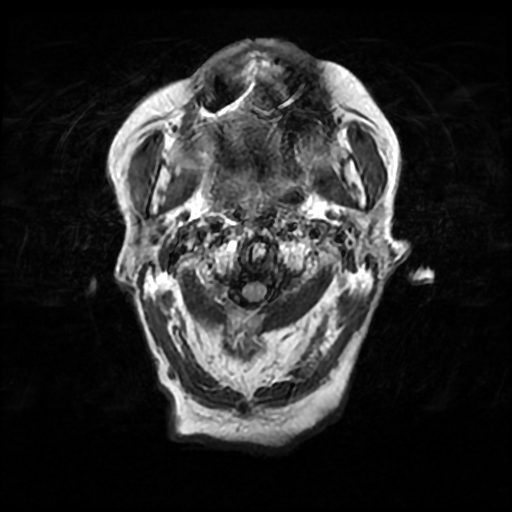
[im 14/28]
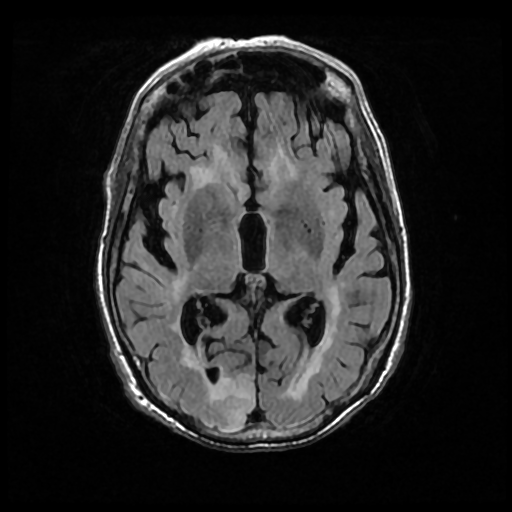
[im 28/28]
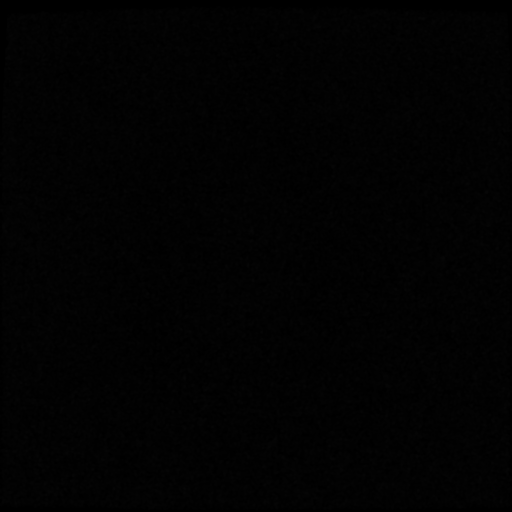

[Series 8: (person_name) · axial · 3.0mm · 0.47mm/px · 1 of 100 slices shown]
[im 1/100]
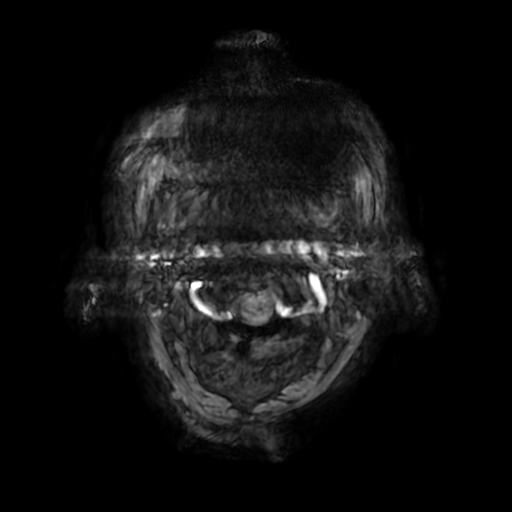

[Series 10: T2 · coronal · 5.0mm · 0.39mm/px · 3 of 28 slices shown (2 of 2)]
[im 1/28]
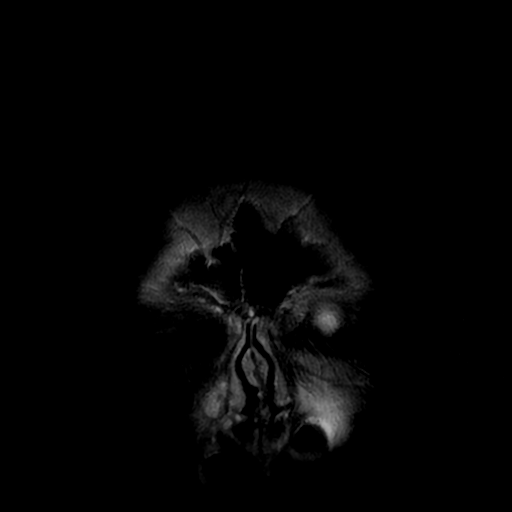
[im 14/28]
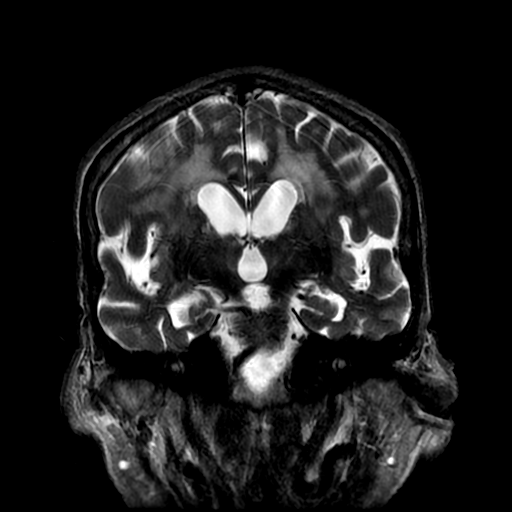
[im 28/28]
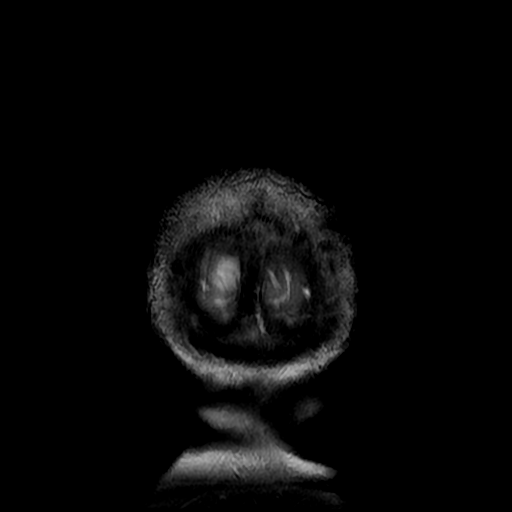

[Series 350: ADC · axial · 3.0mm · 0.94mm/px · z∈[-91,+56]mm · 5 of 50 slices shown (1 of 2)]
[im 1/50]
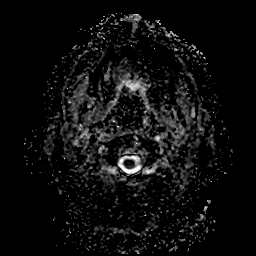
[im 13/50]
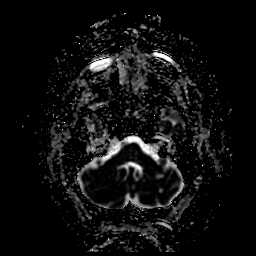
[im 25/50]
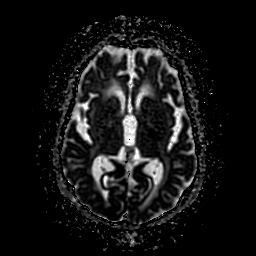
[im 37/50]
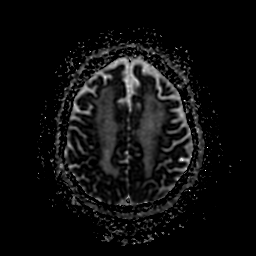
[im 50/50]
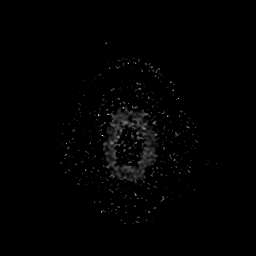

[Series 450: ADC · coronal · 4.0mm · 0.94mm/px · 3 of 35 slices shown (2 of 2)]
[im 1/35]
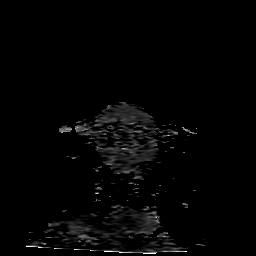
[im 18/35]
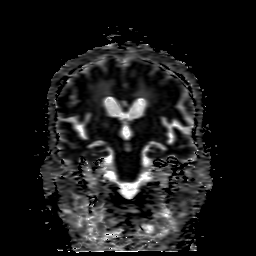
[im 35/35]
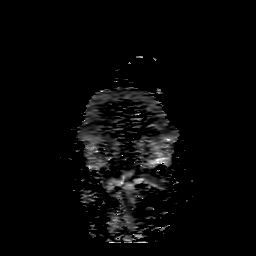

[35 of 48 positions shown; findings below may reference images not displayed]

FINDINGS: Brain: Confluent restricted diffusion in the right occipital cortex,
medial right temporal lobe at the posterior hippocampus, right
thalamus, splenium of the corpus callosum, and patchy along the
right frontal parietal and superficial temporal cortex.

Confluent FLAIR hyperintensity in the cerebral white matter
attributed to severe chronic small vessel ischemia given
demographics and history. Remote small vessel infarcts in the
bilateral cerebellum. T2 hyperintensity in the deep gray nuclei and
pons without noted expansion, likely also chronic and microvascular.
Mild generalized atrophy.

Hemorrhagic appearance along the infarcts considered petechial.

No mass or hydrocephalus.  Mild for age atrophy.

Vascular: Major flow voids are preserved.

Skull and upper cervical spine: No evidence of marrow lesion.

Sinuses/Orbits: Bilateral cataract resection.

Patchy mucosal thickening in the ethmoids and fluid in the right
maxillary sinus.
IMPRESSION: 1. Extensive acute infarct in the right MCA and right PCA (which is
fetal type) distribution as described. Patchy petechial hemorrhage.
2. Extensive white matter disease that is likely chronic small
vessel ischemia.
3. Multiple small remote cerebellar infarcts.

## 2023-09-21 DEATH — deceased
